# Patient Record
Sex: Male | Born: 1990
Health system: Southern US, Community
[De-identification: ages and names within clinical notes are randomized; demographics above are authoritative.]

## PROBLEM LIST (undated history)

## (undated) DIAGNOSIS — Z8679 Personal history of other diseases of the circulatory system: Secondary | ICD-10-CM

## (undated) DIAGNOSIS — R42 Dizziness and giddiness: Secondary | ICD-10-CM

## (undated) DIAGNOSIS — E66813 Obesity, class 3: Secondary | ICD-10-CM

## (undated) DIAGNOSIS — F418 Other specified anxiety disorders: Secondary | ICD-10-CM

## (undated) HISTORY — DX: Personal history of other diseases of the circulatory system: Z86.79

## (undated) HISTORY — DX: Obesity, class 3: E66.813

## (undated) HISTORY — DX: Dizziness and giddiness: R42

## (undated) HISTORY — PX: ABLATION: SHX5711

## (undated) HISTORY — DX: Other specified anxiety disorders: F41.8

## (undated) HISTORY — DX: Morbid (severe) obesity due to excess calories: E66.01

---
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
## 2015-07-16 DIAGNOSIS — R0789 Other chest pain: Secondary | ICD-10-CM

## 2015-07-16 HISTORY — DX: Other chest pain: R07.89

## 2016-06-20 DIAGNOSIS — H66001 Acute suppurative otitis media without spontaneous rupture of ear drum, right ear: Secondary | ICD-10-CM | POA: Diagnosis not present

## 2016-06-20 DIAGNOSIS — J0100 Acute maxillary sinusitis, unspecified: Secondary | ICD-10-CM | POA: Diagnosis not present

## 2016-06-20 DIAGNOSIS — J45909 Unspecified asthma, uncomplicated: Secondary | ICD-10-CM | POA: Diagnosis not present

## 2016-08-17 DIAGNOSIS — J302 Other seasonal allergic rhinitis: Secondary | ICD-10-CM | POA: Diagnosis not present

## 2016-09-14 DIAGNOSIS — H81399 Other peripheral vertigo, unspecified ear: Secondary | ICD-10-CM | POA: Diagnosis not present

## 2016-09-14 DIAGNOSIS — M542 Cervicalgia: Secondary | ICD-10-CM | POA: Diagnosis not present

## 2016-11-01 DIAGNOSIS — R1012 Left upper quadrant pain: Secondary | ICD-10-CM | POA: Diagnosis not present

## 2016-11-01 DIAGNOSIS — R42 Dizziness and giddiness: Secondary | ICD-10-CM | POA: Diagnosis not present

## 2016-11-02 DIAGNOSIS — R0789 Other chest pain: Secondary | ICD-10-CM | POA: Diagnosis not present

## 2016-11-02 DIAGNOSIS — R5381 Other malaise: Secondary | ICD-10-CM | POA: Diagnosis not present

## 2016-11-02 DIAGNOSIS — R5383 Other fatigue: Secondary | ICD-10-CM | POA: Diagnosis not present

## 2016-11-02 DIAGNOSIS — R55 Syncope and collapse: Secondary | ICD-10-CM | POA: Diagnosis not present

## 2016-11-02 DIAGNOSIS — R0602 Shortness of breath: Secondary | ICD-10-CM | POA: Diagnosis not present

## 2016-11-05 DIAGNOSIS — R42 Dizziness and giddiness: Secondary | ICD-10-CM | POA: Diagnosis not present

## 2016-11-26 DIAGNOSIS — J060 Acute laryngopharyngitis: Secondary | ICD-10-CM | POA: Diagnosis not present

## 2016-12-04 DIAGNOSIS — R55 Syncope and collapse: Secondary | ICD-10-CM | POA: Diagnosis not present

## 2016-12-13 DIAGNOSIS — R55 Syncope and collapse: Secondary | ICD-10-CM | POA: Diagnosis not present

## 2016-12-28 DIAGNOSIS — R55 Syncope and collapse: Secondary | ICD-10-CM | POA: Diagnosis not present

## 2017-01-17 DIAGNOSIS — R55 Syncope and collapse: Secondary | ICD-10-CM

## 2017-01-17 HISTORY — DX: Syncope and collapse: R55

## 2017-02-02 DIAGNOSIS — M549 Dorsalgia, unspecified: Secondary | ICD-10-CM | POA: Diagnosis not present

## 2017-02-02 DIAGNOSIS — S39012A Strain of muscle, fascia and tendon of lower back, initial encounter: Secondary | ICD-10-CM | POA: Diagnosis not present

## 2017-02-02 DIAGNOSIS — M545 Low back pain: Secondary | ICD-10-CM | POA: Diagnosis not present

## 2017-02-17 DIAGNOSIS — M545 Low back pain: Secondary | ICD-10-CM | POA: Diagnosis not present

## 2017-02-23 DIAGNOSIS — J060 Acute laryngopharyngitis: Secondary | ICD-10-CM | POA: Diagnosis not present

## 2017-05-16 DIAGNOSIS — J060 Acute laryngopharyngitis: Secondary | ICD-10-CM | POA: Diagnosis not present

## 2017-05-26 DIAGNOSIS — J060 Acute laryngopharyngitis: Secondary | ICD-10-CM | POA: Diagnosis not present

## 2017-07-26 DIAGNOSIS — J060 Acute laryngopharyngitis: Secondary | ICD-10-CM | POA: Diagnosis not present

## 2017-07-26 DIAGNOSIS — R112 Nausea with vomiting, unspecified: Secondary | ICD-10-CM | POA: Diagnosis not present

## 2017-08-08 DIAGNOSIS — J060 Acute laryngopharyngitis: Secondary | ICD-10-CM | POA: Diagnosis not present

## 2017-09-07 DIAGNOSIS — J324 Chronic pansinusitis: Secondary | ICD-10-CM | POA: Diagnosis not present

## 2017-10-18 DIAGNOSIS — R109 Unspecified abdominal pain: Secondary | ICD-10-CM | POA: Diagnosis not present

## 2017-10-18 DIAGNOSIS — Z792 Long term (current) use of antibiotics: Secondary | ICD-10-CM | POA: Diagnosis not present

## 2017-10-18 DIAGNOSIS — Z79891 Long term (current) use of opiate analgesic: Secondary | ICD-10-CM | POA: Diagnosis not present

## 2017-12-10 DIAGNOSIS — I252 Old myocardial infarction: Secondary | ICD-10-CM | POA: Diagnosis not present

## 2017-12-10 DIAGNOSIS — R0789 Other chest pain: Secondary | ICD-10-CM | POA: Diagnosis not present

## 2017-12-10 DIAGNOSIS — R079 Chest pain, unspecified: Secondary | ICD-10-CM | POA: Diagnosis not present

## 2017-12-11 DIAGNOSIS — R079 Chest pain, unspecified: Secondary | ICD-10-CM | POA: Diagnosis not present

## 2017-12-11 DIAGNOSIS — R0602 Shortness of breath: Secondary | ICD-10-CM

## 2017-12-22 DIAGNOSIS — I456 Pre-excitation syndrome: Secondary | ICD-10-CM

## 2017-12-22 DIAGNOSIS — R0789 Other chest pain: Secondary | ICD-10-CM | POA: Diagnosis not present

## 2017-12-22 HISTORY — DX: Pre-excitation syndrome: I45.6

## 2017-12-29 DIAGNOSIS — R0789 Other chest pain: Secondary | ICD-10-CM | POA: Diagnosis not present

## 2017-12-29 DIAGNOSIS — I456 Pre-excitation syndrome: Secondary | ICD-10-CM | POA: Diagnosis not present

## 2018-01-19 DIAGNOSIS — R000 Tachycardia, unspecified: Secondary | ICD-10-CM | POA: Diagnosis not present

## 2018-02-02 DIAGNOSIS — R0789 Other chest pain: Secondary | ICD-10-CM | POA: Diagnosis not present

## 2018-06-10 DIAGNOSIS — J209 Acute bronchitis, unspecified: Secondary | ICD-10-CM | POA: Diagnosis not present

## 2018-07-03 DIAGNOSIS — R1032 Left lower quadrant pain: Secondary | ICD-10-CM | POA: Diagnosis not present

## 2018-07-03 DIAGNOSIS — R300 Dysuria: Secondary | ICD-10-CM | POA: Diagnosis not present

## 2018-07-03 DIAGNOSIS — R109 Unspecified abdominal pain: Secondary | ICD-10-CM | POA: Diagnosis not present

## 2020-02-19 DIAGNOSIS — R55 Syncope and collapse: Secondary | ICD-10-CM | POA: Insufficient documentation

## 2020-02-19 DIAGNOSIS — Z6841 Body Mass Index (BMI) 40.0 and over, adult: Secondary | ICD-10-CM | POA: Insufficient documentation

## 2020-02-19 DIAGNOSIS — E66813 Obesity, class 3: Secondary | ICD-10-CM | POA: Insufficient documentation

## 2020-02-19 DIAGNOSIS — Z8679 Personal history of other diseases of the circulatory system: Secondary | ICD-10-CM | POA: Insufficient documentation

## 2020-02-19 DIAGNOSIS — R42 Dizziness and giddiness: Secondary | ICD-10-CM | POA: Insufficient documentation

## 2020-02-19 DIAGNOSIS — F418 Other specified anxiety disorders: Secondary | ICD-10-CM | POA: Insufficient documentation

## 2020-02-21 ENCOUNTER — Ambulatory Visit: Payer: Self-pay | Admitting: Cardiology

## 2020-03-06 ENCOUNTER — Ambulatory Visit: Payer: Self-pay | Admitting: Cardiology

## 2020-03-10 ENCOUNTER — Encounter: Payer: Self-pay | Admitting: *Deleted

## 2020-03-12 ENCOUNTER — Ambulatory Visit: Payer: Self-pay | Admitting: Cardiology

## 2021-01-29 ENCOUNTER — Emergency Department (HOSPITAL_COMMUNITY): Payer: Managed Care, Other (non HMO)

## 2021-01-29 ENCOUNTER — Emergency Department (HOSPITAL_COMMUNITY)
Admission: EM | Admit: 2021-01-29 | Discharge: 2021-01-29 | Disposition: A | Discharge status: Home / Self Care | Payer: Managed Care, Other (non HMO) | Attending: Emergency Medicine | Admitting: Emergency Medicine

## 2021-01-29 ENCOUNTER — Other Ambulatory Visit: Payer: Self-pay

## 2021-01-29 DIAGNOSIS — H538 Other visual disturbances: Secondary | ICD-10-CM | POA: Insufficient documentation

## 2021-01-29 DIAGNOSIS — R531 Weakness: Secondary | ICD-10-CM | POA: Insufficient documentation

## 2021-01-29 DIAGNOSIS — F0781 Postconcussional syndrome: Secondary | ICD-10-CM

## 2021-01-29 DIAGNOSIS — R519 Headache, unspecified: Secondary | ICD-10-CM | POA: Diagnosis not present

## 2021-01-29 DIAGNOSIS — M542 Cervicalgia: Secondary | ICD-10-CM | POA: Diagnosis not present

## 2021-01-29 DIAGNOSIS — R202 Paresthesia of skin: Secondary | ICD-10-CM

## 2021-01-29 LAB — CBC WITH DIFFERENTIAL/PLATELET
Abs Immature Granulocytes: 0.02 10*3/uL (ref 0.00–0.07)
Basophils Absolute: 0.1 10*3/uL (ref 0.0–0.1)
Basophils Relative: 1 %
Eosinophils Absolute: 0.3 10*3/uL (ref 0.0–0.5)
Eosinophils Relative: 4 %
HCT: 44.8 % (ref 39.0–52.0)
Hemoglobin: 14.9 g/dL (ref 13.0–17.0)
Immature Granulocytes: 0 %
Lymphocytes Relative: 33 %
Lymphs Abs: 2.4 10*3/uL (ref 0.7–4.0)
MCH: 28.2 pg (ref 26.0–34.0)
MCHC: 33.3 g/dL (ref 30.0–36.0)
MCV: 84.8 fL (ref 80.0–100.0)
Monocytes Absolute: 0.6 10*3/uL (ref 0.1–1.0)
Monocytes Relative: 8 %
Neutro Abs: 4 10*3/uL (ref 1.7–7.7)
Neutrophils Relative %: 54 %
Platelets: 306 10*3/uL (ref 150–400)
RBC: 5.28 MIL/uL (ref 4.22–5.81)
RDW: 12.4 % (ref 11.5–15.5)
WBC: 7.3 10*3/uL (ref 4.0–10.5)
nRBC: 0 % (ref 0.0–0.2)

## 2021-01-29 LAB — BASIC METABOLIC PANEL
Anion gap: 5 (ref 5–15)
BUN: 10 mg/dL (ref 6–20)
CO2: 30 mmol/L (ref 22–32)
Calcium: 9.1 mg/dL (ref 8.9–10.3)
Chloride: 102 mmol/L (ref 98–111)
Creatinine, Ser: 0.82 mg/dL (ref 0.61–1.24)
GFR, Estimated: >60 mL/min (ref >60)
Glucose, Bld: 94 mg/dL (ref 70–99)
Potassium: 4 mmol/L (ref 3.5–5.1)
Sodium: 137 mmol/L (ref 135–145)

## 2021-01-29 MED ORDER — PROCHLORPERAZINE MALEATE 5 MG PO TABS: 5.0000 mg | ORAL_TABLET | Freq: Four times a day (QID) | ORAL | 0 refills | Status: DC | PRN

## 2021-01-29 MED ORDER — IOHEXOL 350 MG/ML SOLN
65.0000 mL | Freq: Once | INTRAVENOUS | Status: AC | PRN
  Administered 2021-01-29: 65 mL via INTRAVENOUS

## 2021-01-29 MED ORDER — SODIUM CHLORIDE 0.9 % IV BOLUS
1000.0000 mL | Freq: Once | INTRAVENOUS | Status: AC
  Administered 2021-01-29: 1000 mL via INTRAVENOUS

## 2021-01-29 MED ORDER — PROCHLORPERAZINE EDISYLATE 10 MG/2ML IJ SOLN
10.0000 mg | Freq: Once | INTRAMUSCULAR | Status: AC
  Administered 2021-01-29: 10 mg via INTRAVENOUS
  Filled 2021-01-29: qty 2

## 2021-01-29 MED ORDER — DIPHENHYDRAMINE HCL 50 MG/ML IJ SOLN
25.0000 mg | Freq: Once | INTRAMUSCULAR | Status: AC
  Administered 2021-01-29: 25 mg via INTRAVENOUS
  Filled 2021-01-29: qty 1

## 2021-01-29 NOTE — ED Provider Notes (Signed)
[facility] Provider Note   CSN: [id number] Arrival date & time: 01/29/21  1319     History Chief Complaint  Patient presents with   Head Injury    Mike BalsamJason Nelson is a 30 y.o. male.   Head Injury Associated symptoms: headache and numbness   Associated symptoms: no nausea, no neck pain and no vomiting      Patient has a history of three prior concussions is coming in with a headache, blurry vision, and right sided weakness that is waxing and waning after another concussion on Monday. He said that during his Monday concussion he did not lose consciousness but felt dizzy and got checked out at urgent care where he was diagnosed with a concussion. However he has fallen about three times in the last several days because he suddenly started experiencing arm and leg weakness on his right side. He said that the weakness comes and goes and lasts for about 10-15 minutes and is associated with the blurry vision and the headaches. Either a headache or the blurry vision will precede the weakness. When he gets the weakness he has fallen if he is not holding on to something because he is unable to feel his limb. He says that he is able to still move his arm and leg but they feel strange and weak. He denies any tingling with this. The weakness does not involve his face, he denies any facial asymmetry, and does not have any problems with speech. He also denies any nausea or vomiting. These symptoms he has been having since his concussion are similar to his prior concussions except that the weakness is new. Denies any neck pain. The dizziness he describes as the room is spinning, it is not made any worse by changes in head or neck position. The blurry vision occurs in both eyes in all fields of vision. He endorses a history of migraine headaches with aura but that the vision changes he gets with these are different that his current presentation. He does not take any  medication for these headaches frequently, but has tried excedrin in the past. He says that these symptoms are all waxing and waning and has not identified any triggers that cause them or anything that makes them better.  Past Medical History:  Diagnosis Date   Anxiety with depression    Atypical chest pain 07/16/2015   Class 3 severe obesity due to excess calories with body mass index (BMI) of 40.0 to 44.9 in adult [facility](HCC)    History of Wolff-Parkinson-White (WPW) syndrome    Postural dizziness with presyncope    Vasovagal syncope 01/17/2017   WPW (Wolff-Parkinson-White syndrome) 12/22/2017    Patient Active Problem List   Diagnosis Date Noted   Anxiety with depression    Class 3 severe obesity due to excess calories with body mass index (BMI) of 40.0 to 44.9 in adult [facility](HCC)    History of Wolff-Parkinson-White (WPW) syndrome    Postural dizziness with presyncope    WPW (Wolff-Parkinson-White syndrome) 12/22/2017   Vasovagal syncope 01/17/2017   Atypical chest pain 07/16/2015       Family History  Problem Relation Age of Onset   Diabetes Mother    Hypertension Father     Social History   Tobacco Use   Smoking status: Never   Smokeless tobacco: Never    Home Medications Prior to Admission medications   Medication Sig Start Date End Date Taking? Authorizing Provider  Ascorbic Acid (VITAMIN C)  1000 MG tablet Take 1,000 mg by mouth daily.   Yes [provider]  fexofenadine (ALLEGRA) 60 MG tablet Take 60 mg by mouth daily.   Yes [provider]  vitamin B-12 (CYANOCOBALAMIN) 1000 MCG tablet Take 1,000 mcg by mouth daily.   Yes [provider]  Cholecalciferol (VITAMIN D) 50 MCG (2000 UT) CAPS Take 50 mcg by mouth daily. Patient not taking: Reported on 01/29/2021    [provider]    Allergies    Mushroom extract complex and Pineapple  Review of Systems   Review of Systems  Eyes:  Positive for visual disturbance.  Respiratory:  Negative  for shortness of breath.   Cardiovascular:  Negative for chest pain and palpitations.  Gastrointestinal:  Negative for nausea and vomiting.  Musculoskeletal:  Negative for neck pain and neck stiffness.  Neurological:  Positive for dizziness, numbness and headaches. Negative for syncope, facial asymmetry and speech difficulty.  Psychiatric/Behavioral:  Positive for confusion and decreased concentration.    Physical Exam Updated Vital Signs BP (!) 126/93   Pulse (!) 110   Resp 17   Ht 5\' 11"  (1.803 m)   Wt 130.2 kg   SpO2 99%   BMI 40.03 kg/m   Physical Exam Constitutional:      Appearance: He is obese.  HENT:     Head: Normocephalic.  Eyes:     General: No visual field deficit.    Extraocular Movements: Extraocular movements intact.     Conjunctiva/sclera: Conjunctivae normal.     Pupils: Pupils are equal, round, and reactive to light.  Cardiovascular:     Rate and Rhythm: Normal rate and regular rhythm.  Pulmonary:     Effort: Pulmonary effort is normal.     Breath sounds: Normal breath sounds.  Abdominal:     Palpations: Abdomen is soft.  Musculoskeletal:     Cervical back: Normal range of motion. Tenderness present.  Neurological:     General: No focal deficit present.     Mental Status: He is alert and oriented to person, place, and time.     Cranial Nerves: No cranial nerve deficit or facial asymmetry.     Sensory: No sensory deficit.     Motor: Motor function is intact. No weakness.     Coordination: Coordination is intact.    ED Results / Procedures / Treatments   Labs (all labs ordered are listed, but only abnormal results are displayed) Labs Reviewed  BASIC METABOLIC PANEL  CBC WITH DIFFERENTIAL/PLATELET    EKG EKG Interpretation  Date/Time:  Thursday January 29 2021 13:34:02 EDT Ventricular Rate:  88 PR Interval:  150 QRS Duration: 80 QT Interval:  340 QTC Calculation: 411 R Axis:   48 Text Interpretation: Normal sinus rhythm Normal ECG No prior  ECG for comparison. No STEMI Confirmed by [id number] (Theda Belfast) on 01/29/2021 3:37:18 PM  Radiology CT Angio Head W or Wo Contrast  Result Date: 01/29/2021 CLINICAL DATA:  Neuro deficit, acute, stroke suspected; Waxing and waning right arm and right leg numbness and weakness, vision changes, and dizziness after head trauma. EXAM: CT ANGIOGRAPHY HEAD AND NECK TECHNIQUE: Multidetector CT imaging of the head and neck was performed using the standard protocol during bolus administration of intravenous contrast. Multiplanar CT image reconstructions and MIPs were obtained to evaluate the vascular anatomy. Carotid stenosis measurements (when applicable) are obtained utilizing NASCET criteria, using the distal internal carotid diameter as the denominator. CONTRAST:  32mL OMNIPAQUE IOHEXOL 350 MG/ML SOLN COMPARISON:  Brain MRI performed earlier today 01/29/2021. FINDINGS: CTA NECK FINDINGS Aortic arch: Standard aortic branching. The visualized aortic arch is normal in caliber. No hemodynamically significant innominate or proximal subclavian artery stenosis. Right carotid system: CCA and ICA patent within the neck without stenosis or significant atherosclerotic disease. Partially retropharyngeal course of the ICA. Left carotid system: CCA and ICA patent within the neck without stenosis or significant atherosclerotic disease. Partially retropharyngeal course of the ICA. Vertebral arteries: Vertebral arteries codominant and patent within the neck without stenosis. Skeleton: Reversal of the expected cervical lordosis. No acute bony abnormality or aggressive osseous lesion. 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 Other neck: No neck mass. Thyroid unremarkable. Mildly enlarged right level 2 lymph node measuring 12 mm in short axis. Elsewhere within the neck, lymph nodes are somewhat prominent, but measure subcentimeter in short axis. Upper  chest: No consolidation within the imaged lung apices. Review of the MIP images confirms the above findings CTA HEAD FINDINGS Anterior circulation: The intracranial internal carotid arteries are patent. The M1 middle cerebral arteries are patent. No M2 proximal branch occlusion or high-grade proximal stenosis is identified. The anterior cerebral arteries are patent. No intracranial aneurysm is identified. Posterior circulation: The intracranial vertebral arteries are patent. The basilar artery is patent. The posterior cerebral arteries are patent. Posterior communicating arteries are hypoplastic or absent bilaterally. Venous sinuses: Within the limitations of contrast timing, no convincing thrombus. Anatomic variants: As described Review of the MIP images confirms the above findings IMPRESSION: CTA neck: 1. The common carotid, internal carotid and vertebral arteries are patent within the neck without stenosis, significant atherosclerotic disease or evidence of dissection. 2. Mildly enlarged right level 2 lymph node measuring 13 mm in short axis. Lymph nodes elsewhere within the neck are somewhat prominent, but measure subcentimeter in short axis. Clinical correlation and clinical follow-up recommended (with imaging follow-up as clinically warranted). 3. 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 CTA head: Unremarkable exam. No intracranial large vessel occlusion or proximal high-grade arterial stenosis. Electronically Signed   By: Jackey Loge D.O.   On: 01/29/2021 20:17   CT HEAD WO CONTRAST ( )  Result Date: 01/29/2021 CLINICAL DATA:  Head trauma EXAM: CT HEAD WITHOUT CONTRAST TECHNIQUE: Contiguous axial images were obtained from the base of the skull through the vertex without intravenous contrast. COMPARISON:  10/20/2020 FINDINGS: Brain: No evidence of acute infarction, hemorrhage, hydrocephalus, extra-axial collection or  mass lesion/mass effect. Vascular: No hyperdense vessel or unexpected calcification. Skull: Normal. Negative for fracture or focal lesion. Sinuses/Orbits: No acute finding. Other: The mastoid air cells are clear. IMPRESSION: No acute intracranial process. Electronically Signed   By: Wiliam Ke M.D.   On: 01/29/2021 14:57   CT Angio Neck W and/or Wo Contrast  Result Date: 01/29/2021 CLINICAL DATA:  Neuro deficit, acute, stroke suspected; Waxing and waning right arm and right leg numbness and weakness, vision changes, and dizziness after head trauma. EXAM: CT ANGIOGRAPHY HEAD AND NECK TECHNIQUE: Multidetector CT imaging of the head and neck was performed using the standard protocol during bolus administration of intravenous contrast. Multiplanar CT image reconstructions and MIPs were obtained to evaluate the vascular anatomy. Carotid stenosis measurements (when applicable) are obtained utilizing NASCET criteria, using the distal internal carotid diameter as the denominator. CONTRAST:  65mL OMNIPAQUE IOHEXOL 350 MG/ML SOLN COMPARISON:  Brain MRI performed earlier today 01/29/2021. FINDINGS: CTA NECK  FINDINGS Aortic arch: Standard aortic branching. The visualized aortic arch is normal in caliber. No hemodynamically significant innominate or proximal subclavian artery stenosis. Right carotid system: CCA and ICA patent within the neck without stenosis or significant atherosclerotic disease. Partially retropharyngeal course of the ICA. Left carotid system: CCA and ICA patent within the neck without stenosis or significant atherosclerotic disease. Partially retropharyngeal course of the ICA. Vertebral arteries: Vertebral arteries codominant and patent within the neck without stenosis. Skeleton: Reversal of the expected cervical lordosis. No acute bony abnormality or aggressive osseous lesion. 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  Other neck: No neck mass. Thyroid unremarkable. Mildly enlarged right level 2 lymph node measuring 12 mm in short axis. Elsewhere within the neck, lymph nodes are somewhat prominent, but measure subcentimeter in short axis. Upper chest: No consolidation within the imaged lung apices. Review of the MIP images confirms the above findings CTA HEAD FINDINGS Anterior circulation: The intracranial internal carotid arteries are patent. The M1 middle cerebral arteries are patent. No M2 proximal branch occlusion or high-grade proximal stenosis is identified. The anterior cerebral arteries are patent. No intracranial aneurysm is identified. Posterior circulation: The intracranial vertebral arteries are patent. The basilar artery is patent. The posterior cerebral arteries are patent. Posterior communicating arteries are hypoplastic or absent bilaterally. Venous sinuses: Within the limitations of contrast timing, no convincing thrombus. Anatomic variants: As described Review of the MIP images confirms the above findings IMPRESSION: CTA neck: 1. The common carotid, internal carotid and vertebral arteries are patent within the neck without stenosis, significant atherosclerotic disease or evidence of dissection. 2. Mildly enlarged right level 2 lymph node measuring 13 mm in short axis. Lymph nodes elsewhere within the neck are somewhat prominent, but measure subcentimeter in short axis. Clinical correlation and clinical follow-up recommended (with imaging follow-up as clinically warranted). 3. 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 CTA head: Unremarkable exam. No intracranial large vessel occlusion or proximal high-grade arterial stenosis. Electronically Signed   By: Jackey Loge D.O.   On: 01/29/2021 20:17   MR BRAIN WO CONTRAST  Result Date: 01/29/2021 CLINICAL DATA:  Neuro deficit, acute, stroke suspected; Waxing and waning right arm and  right leg numbness and weakness, vision changes, and dizziness after head trauma. EXAM: MRI HEAD WITHOUT CONTRAST TECHNIQUE: Multiplanar, multiecho pulse sequences of the brain and surrounding structures were obtained without intravenous contrast. COMPARISON:  Prior head CT examinations 01/29/2021 and earlier. FINDINGS: Brain: Cerebral volume is normal. No cortical encephalomalacia is identified. No significant cerebral white matter disease. The cerebellar tonsils are somewhat low-lying, extending 2-3 mm below the level of the foramen magnum (but within normal limits). There is no acute infarct. No evidence of an intracranial mass. No chronic intracranial blood products. No extra-axial fluid collection. No midline shift. Vascular: Maintained flow voids within the proximal large arterial vessels. Skull and upper cervical spine: No focal marrow lesion. Sinuses/Orbits: Visualized orbits show no acute finding. 18 mm left maxillary sinus mucous retention cyst. Other: Right parietal scalp edema. IMPRESSION: Unremarkable non-contrast MRI appearance of the brain. No evidence of acute intracranial abnormality. 18 mm left maxillary sinus mucous retention cyst. Right parietal scalp edema. Electronically Signed   By: Jackey Loge D.O.   On: 01/29/2021 17:45    Procedures Procedures   Medications Ordered in ED Medications  sodium chloride 0.9 % bolus 1,000 mL (1,000 mLs Intravenous  New Bag/Given 01/29/21 1629)  prochlorperazine (COMPAZINE) injection 10 mg (10 mg Intravenous Given 01/29/21 1630)  diphenhydrAMINE (BENADRYL) injection 25 mg (25 mg Intravenous Given 01/29/21 1630)  iohexol (OMNIPAQUE) 350 MG/ML injection 65 mL (65 mLs Intravenous Contrast Given 01/29/21 1934)    ED Course  I have reviewed the triage vital signs and the nursing notes.  Pertinent labs & imaging results that were available during my care of the patient were reviewed by me and considered in my medical decision making (see chart for  details).  Clinical Course as of 01/29/21 2034  Thu Jan 29, 2021  1439 Patient not in room for evaluation [BM]  1458 Patient not in room for evaluation. [BM]    Clinical Course User Index [BM] Elizabeth Palau   MDM Rules/Calculators/A&P                           Patient has normal neuro exam with no appreciable deficits, says that symptoms continue to wax and wane since presentation. Endorses similar symptoms with prior concussions and migraines. CT of head did not show acute bleed. MRI brain did not show infarct. CTA also unremarkable for vessel disease, one mildly enlarged lymph node, recommend follow up with PCP. Headache improved with headache cocktail. Suspect presentation is related to patients history of recurrent head injury and that this related to his concussion and stable for discharge.  Final Clinical Impression(s) / ED Diagnoses Final diagnoses:  None    Rx / DC Orders ED Discharge Orders     None        Ilene Qua, MD 01/29/21 2035    Tegeler, Canary Brim, MD 01/30/21 [phone]

## 2021-01-29 NOTE — ED Provider Notes (Signed)
Emergency Medicine Provider Triage Evaluation Note  Mike Nelson , a 30 y.o. male  was evaluated in triage.  Pt complains of head injury dizziness, right-sided weakness.  Reports that on Monday he had a mechanical fall which led him to hit his head on a shelving unit.  No loss of consciousness associated with his head injury.  Patient endorsed dizziness intermittently after this incident.  Patient states that he was diagnosed with a concussion.  Over the following days patient has had intermittent dizziness, blurry vision, and right-sided weakness.  Patient states that right-sided weakness has caused him to fall multiple times at home.  Last episode of right-sided weakness occurred yesterday.  Patient endorses headache at present.  At present patient denies any numbness, weakness, facial asymmetry, aphasia, dysphagia.  Patient denies any neck pain, back pain, saddle anesthesia, bowel or bladder dysfunction.  Patient is not on any blood thinners.  Review of Systems  Positive: Blurry vision, dizziness, numbness, weakness, headache Negative: neck pain, back pain, saddle anesthesia, bowel or bladder dysfunction  Physical Exam  Ht 5\' 11"  (1.803 m)   Wt 130.2 kg   BMI 40.03 kg/m  Gen:   Awake, no distress   Resp:  Normal effort  MSK:   Moves extremities without difficulty.  No midline tenderness or deformity to cervical, thoracic, or lumbar spine. Other:  CN II through XII intact.  Pronator drift negative.  +5 bilateral upper and lower extremities.  Sensation to light touch intact to bilateral upper and lower extremities.  Head is atraumatic.  Neck supple.  Medical Decision Making  Medically screening exam initiated at 1:35 PM.  Appropriate orders placed.  Mike Nelson was informed that the remainder of the evaluation will be completed by another provider, this initial triage assessment does not replace that evaluation, and the importance of remaining in the ED until their evaluation is  complete.  The patient appears stable so that the remainder of the work up may be completed by another provider.      Gypsy Balsam, PA-C 01/29/21 1338    01/31/21, MD 01/31/21 [phone]

## 2021-01-29 NOTE — Discharge Instructions (Addendum)
You were seen for blurry vision and weakness today at [facility]. We did some imaging of your head and neck and were able to determine that you were not having a stroke, a brain bleed, or damage to some of the vessels in your neck. You were also given some medication to help with your headaches which you can continue to take as needed. We did see one slightly enlarged lymph node in your neck which could be related to a viral illness. We recommend that you follow up with your primary care doctor who can also set you up with a neurology appointment if desired.   If your symptoms worsen and you have trouble walking, new slurred speech, or worsening confusion we recommend that you come back to the emergency room to get checked out.

## 2021-01-29 NOTE — ED Notes (Signed)
Pt discharged and ambulated out of the ED without difficulty. 

## 2021-01-29 NOTE — ED Notes (Signed)
Provider at bedside

## 2021-01-29 NOTE — ED Notes (Signed)
Patient returned from MRI.

## 2021-01-29 NOTE — ED Triage Notes (Signed)
Pt dx with at [facility] with concussion from fall on Monday. Since then has had multiple episodes of dizziness and weakness in R side of body, causing him to fall. Last fall yesterday.

## 2021-03-18 ENCOUNTER — Encounter: Payer: Self-pay | Admitting: Neurology

## 2021-03-18 ENCOUNTER — Ambulatory Visit: Payer: Managed Care, Other (non HMO) | Admitting: Neurology

## 2021-03-18 VITALS — BP 123/79 | HR 75 | Ht 71.0 in | Wt 290.0 lb

## 2021-03-18 DIAGNOSIS — F0781 Postconcussional syndrome: Secondary | ICD-10-CM | POA: Diagnosis not present

## 2021-03-18 DIAGNOSIS — R42 Dizziness and giddiness: Secondary | ICD-10-CM | POA: Diagnosis not present

## 2021-03-18 MED ORDER — AMITRIPTYLINE HCL 25 MG PO TABS: 25.0000 mg | ORAL_TABLET | Freq: Every day | ORAL | 0 refills | Status: DC

## 2021-03-18 NOTE — Patient Instructions (Signed)
Start Amitriptyline 1/2 tablet for the first week then increase to full tablet nightly  Return as needed

## 2021-03-18 NOTE — Progress Notes (Signed)
[facility]  PATIENT: Mike Nelson DOB: April 20, 1991  REFERRING CLINICIAN: Maurie Boettcher T, F* HISTORY FROM: Patient  REASON FOR VISIT: Headaches, Numbness/tingling, dizziness   HISTORICAL  CHIEF COMPLAINT:  Chief Complaint  Patient presents with   New Patient (Initial Visit)    Rm 12, with mother, pt c/o left side numbness and tingling     HISTORY OF PRESENT ILLNESS:  This is a 30 year old male with past medical history of obesity who is presenting after concussion with multiple complaints including headache, left-sided weakness and numbness, and multiple falls.  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 Patient said that he did not pass out after the event but 1 hour later felt dizzy therefore went to the urgent care and was diagnosed with concussion.  Then a couple days after his diagnosis of concussion he started to fall.  He describes diffuse weakness and body goes limp prior to the fall. He presented to the ED  for recurrent fall and left-sided weakness.  He had a full work-up including CT, CTA and MRI brain which were all negative for any acute stroke, no vertebral dissection and no other abnormal finding. He also reported numbness starting from the left collarbone all the way down to left foot numbness, intermittent last between 30 minutes to 1 hour, nothing makes it better or worse and when he has the numbness there is associated weakness.  He also report what he describes as "stroke-like symptoms".  Patient said that he would get extremely cold, lose the grip of his hand, he will be laying in his bed and starts shaking uncontrollably. This can last up to 45 minutes and during this episode he is perfectly aware and even can have a full conversation but is unable to stop the shaking. Additional complaint brought up today are his headaches.  He said that he has a history of  headaches/migraine but since the concussion the headaches are worse He stated his sleep is difficult sometimes he cannot sleep the whole night he is currently under a lot of stress bcause his job is going to close soon, and He has a baby on the way in 1 month.  Currently he is looking for another job but stated that his stress level is very high.   OTHER MEDICAL CONDITIONS: None reported    REVIEW OF SYSTEMS: Full 14 system review of systems performed and negative with exception of: as noted in the HPI  ALLERGIES: Allergies  Allergen Reactions   Mushroom Extract Complex Swelling   Pineapple Hives    HOME MEDICATIONS: Outpatient Medications Prior to Visit  Medication Sig Dispense Refill   Ascorbic Acid (VITAMIN C) 1000 MG tablet Take 1,000 mg by mouth daily.     Cholecalciferol (VITAMIN D) 50 MCG (2000 UT) CAPS Take 50 mcg by mouth daily.     fexofenadine (ALLEGRA) 60 MG tablet Take 60 mg by mouth daily.     prochlorperazine (COMPAZINE) 5 MG tablet Take 1 tablet (5 mg total) by mouth every 6 (six) hours as needed for nausea or vomiting. 20 tablet 0   vitamin B-12 (CYANOCOBALAMIN) 1000 MCG tablet Take 1,000 mcg by mouth daily.     No facility-administered medications prior to visit.    PAST MEDICAL HISTORY: Past Medical History:  Diagnosis Date   Anxiety with depression    Atypical chest pain 07/16/2015   Class 3 severe obesity due to  excess calories with body mass index (BMI) of 40.0 to 44.9 in adult [facility])    History of Wolff-Parkinson-White (WPW) syndrome    Postural dizziness with presyncope    Vasovagal syncope 01/17/2017   WPW (Wolff-Parkinson-White syndrome) 12/22/2017    PAST SURGICAL HISTORY: Past Surgical History:  Procedure Laterality Date   ABLATION     cardiac    FAMILY HISTORY: Family History  Problem Relation Age of Onset   Diabetes Mother    Hypertension Father     SOCIAL HISTORY: Social History   Socioeconomic History   Marital status: Married     Spouse name: Not on file   Number of children: Not on file   Years of education: Not on file   Highest education level: Not on file  Occupational History   Not on file  Tobacco Use   Smoking status: Never   Smokeless tobacco: Never  Substance and Sexual Activity   Alcohol use: Not on file   Drug use: Not on file   Sexual activity: Not on file  Other Topics Concern   Not on file  Social History Narrative   Not on file   Social Determinants of Health   Financial Resource Strain: Not on file  Food Insecurity: Not on file  Transportation Needs: Not on file  Physical Activity: Not on file  Stress: Not on file  Social Connections: Not on file  Intimate Partner Violence: Not on file    PHYSICAL EXAM  GENERAL EXAM/CONSTITUTIONAL: Vitals:  Vitals:   03/18/21 0816  BP: 123/79  Pulse: 75  Weight: 290 lb (131.5 kg)  Height: 5\' 11"  (1.803 m)   Body mass index is 40.45 kg/m. Wt Readings from Last 3 Encounters:  03/18/21 290 lb (131.5 kg)  01/29/21 287 lb (130.2 kg)   Patient is in no distress; well developed, nourished and groomed; neck is supple  CARDIOVASCULAR: Examination of carotid arteries is normal; no carotid bruits Regular rate and rhythm, no murmurs Examination of peripheral vascular system by observation and palpation is normal  EYES: Pupils round and reactive to light, Visual fields full to confrontation, Extraocular movements intacts,   MUSCULOSKELETAL: Gait, strength, tone, movements noted in Neurologic exam below  NEUROLOGIC: MENTAL STATUS:  No flowsheet data found. awake, alert, oriented to person, place and time recent and remote memory intact normal attention and concentration language fluent, comprehension intact, naming intact fund of knowledge appropriate  CRANIAL NERVE:  2nd, 3rd, 4th, 6th - pupils equal and reactive to light, visual fields full to confrontation, extraocular muscles intact, no nystagmus 5th - facial sensation symmetric 7th  - facial strength symmetric 8th - hearing intact 9th - palate elevates symmetrically, uvula midline 11th - shoulder shrug symmetric 12th - tongue protrusion midline  MOTOR:  normal bulk and tone, full strength in the BUE, BLE  SENSORY:  normal and symmetric to light touch, pinprick, temperature, vibration  COORDINATION:  finger-nose-finger, fine finger movements normal  REFLEXES:  deep tendon reflexes present and symmetric  GAIT/STATION:  normal     DIAGNOSTIC DATA (LABS, IMAGING, TESTING) - I reviewed patient records, labs, notes, testing and imaging myself where available.  Lab Results  Component Value Date   WBC 7.3 01/29/2021   HGB 14.9 01/29/2021   HCT 44.8 01/29/2021   MCV 84.8 01/29/2021   PLT 306 01/29/2021      Component Value Date/Time   NA 137 01/29/2021 1339   K 4.0 01/29/2021 1339   CL 102 01/29/2021 1339  CO2 30 01/29/2021 1339   GLUCOSE 94 01/29/2021 1339   BUN 10 01/29/2021 1339   CREATININE 0.82 01/29/2021 1339   CALCIUM 9.1 01/29/2021 1339   GFRNONAA >60 01/29/2021 1339   No results found for: CHOL, HDL, LDLCALC, LDLDIRECT, TRIG, CHOLHDL No results found for: SWFU9N No results found for: VITAMINB12 No results found for: TSH  CT Head 8/18 No acute intracranial process   CTA neck 8/18 1. The common carotid, internal carotid and vertebral arteries are patent within the neck without stenosis, significant atherosclerotic disease or evidence of dissection. 2. Mildly enlarged right level 2 lymph node measuring 13 mm in short axis. Lymph nodes elsewhere within the neck are somewhat prominent, but measure subcentimeter in short axis. Clinical correlation and clinical follow-up recommended (with imaging follow-up as clinically warranted). 3. 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    CTA head 8/18: Unremarkable exam. No intracranial large vessel occlusion or  proximal high-grade arterial stenosis.    ASSESSMENT AND PLAN  30 y.o. year old male with no reported past medical history who is presenting after a total of 3 concussions.  Currently he reports symptom of headaches, numbness and tingling on the left side of his body associated with weakness.  This episodes are intermittent.  He also reported high stress due to the fact that he might be losing his job soon and he has a baby on the way.  His brain imaging are all within normal limits, no signs of stroke and no evidence of cervical dissection.  I explained to the patient that the headaches are likely from postconcussive syndrome.  For his uncontrolled shaking, numbness, tinglings these are less likely seizure and may be related to his increased stress level.  I will start the patient on amitriptyline 25 mg nightly to help with his headaches and also to help him with his anxiety.  Return as needed      1. Post concussive syndrome     PLAN: Start Amitriptyline 1/2 tablet for the first week then increase to full tablet nightly  Return as needed   No orders of the defined types were placed in this encounter.   Meds ordered this encounter  Medications   amitriptyline (ELAVIL) 25 MG tablet    Sig: Take 1 tablet (25 mg total) by mouth at bedtime.    Dispense:  30 tablet    Refill:  0    Return if symptoms worsen or fail to improve.    Windell Norfolk, MD 03/18/2021, 5:31 PM  [facility] [address], [address] [place], [place] [address] [phone]

## 2021-04-10 ENCOUNTER — Encounter: Payer: Self-pay | Admitting: Sports Medicine

## 2021-04-10 ENCOUNTER — Ambulatory Visit: Payer: Managed Care, Other (non HMO) | Admitting: Sports Medicine

## 2021-04-10 ENCOUNTER — Other Ambulatory Visit: Payer: Self-pay

## 2021-04-10 DIAGNOSIS — M79674 Pain in right toe(s): Secondary | ICD-10-CM | POA: Diagnosis not present

## 2021-04-10 DIAGNOSIS — L600 Ingrowing nail: Secondary | ICD-10-CM | POA: Diagnosis not present

## 2021-04-10 NOTE — Patient Instructions (Signed)

## 2021-04-10 NOTE — Progress Notes (Signed)
Subjective: Mike Nelson is a 30 y.o. male patient presents to office today complaining of a moderately painful incurvated, red, hot, swollen lateral nail border of the first toe on the right foot. This has been present for several weeks to months on his second round of antibiotics as prescribed by PCP. Patient has treated this by soaking.  Patient denies fever/chills/nausea/vomitting/any other related constitutional symptoms at this time.  Patient Active Problem List   Diagnosis Date Noted   Anxiety with depression    Class 3 severe obesity due to excess calories with body mass index (BMI) of 40.0 to 44.9 in adult [facility])    History of Wolff-Parkinson-White (WPW) syndrome    Postural dizziness with presyncope    WPW (Wolff-Parkinson-White syndrome) 12/22/2017   Vasovagal syncope 01/17/2017   Atypical chest pain 07/16/2015    Current Outpatient Medications on File Prior to Visit  Medication Sig Dispense Refill   amitriptyline (ELAVIL) 25 MG tablet Take 1 tablet (25 mg total) by mouth at bedtime. 30 tablet 0   Ascorbic Acid (VITAMIN C) 1000 MG tablet Take 1,000 mg by mouth daily.     cephALEXin (KEFLEX) 500 MG capsule Take 500 mg by mouth 3 (three) times daily.     Cholecalciferol (VITAMIN D) 50 MCG (2000 UT) CAPS Take 50 mcg by mouth daily.     clindamycin (CLEOCIN) 300 MG capsule Take 300 mg by mouth 3 (three) times daily.     fexofenadine (ALLEGRA) 60 MG tablet Take 60 mg by mouth daily.     meclizine (ANTIVERT) 25 MG tablet Take 25 mg by mouth 3 (three) times daily as needed.     No current facility-administered medications on file prior to visit.    Allergies  Allergen Reactions   Mushroom Extract Complex Swelling   Pineapple Hives    Objective:  There were no vitals filed for this visit.  General: Well developed, nourished, in no acute distress, alert and oriented x3   Dermatology: Skin is warm, dry and supple bilateral.  Right hallux nail appears to be severely incurvated  with hyperkeratosis formation at the distal aspects of the lateral nail border. (+) Erythema. (+) Edema. (-) serosanguous drainage present. The remaining nails appear unremarkable at this time. There are no open sores, lesions or other signs of infection  present.  Vascular: Dorsalis Pedis artery and Posterior Tibial artery pedal pulses are 2/4 bilateral with immedate capillary fill time. Pedal hair growth present. No lower extremity edema.   Neruologic: Grossly intact via light touch bilateral.  Musculoskeletal: Tenderness to palpation of the right hallux lateral nail fold(s). Muscular strength within normal limits in all groups bilateral.   Assesement and Plan: Problem List Items Addressed This Visit   None Visit Diagnoses     Ingrown nail    -  Primary   R hallux lateral margin   Toe pain, right           -Discussed treatment alternatives and plan of care; Explained permanent/temporary nail avulsion and post procedure course to patient. Patient elects for permanent nail removal right hallux lateral margin with phenol - After a verbal and written consent, injected 3 ml of a 50:50 mixture of 2% plain  lidocaine and 0.5% plain marcaine in a normal hallux block fashion. Next, a  betadine prep was performed. Anesthesia was tested and found to be appropriate.  The offending right hallux lateral nail border was then incised from the hyponychium to the epinychium. The offending nail border was removed and  cleared from the field. The area was curretted for any remaining nail or spicules. Phenol application performed and the area was then flushed with alcohol and dressed with antibiotic cream and a dry sterile dressing. -Patient was instructed to leave the dressing intact for today and begin soaking  in a weak solution of betadine or Epsom salt and water tomorrow. Patient was instructed to  soak for 15-20 minutes each day and apply Neosporin and a gauze or bandaid dressing each day. -Continue  oral antibiotics as given by PCP until completed -Patient was instructed to monitor the toe for signs of infection and return to office if toe becomes red, hot or swollen. -Advised ice, elevation, and tylenol or motrin if needed for pain.  -Patient is to return in 2-3 weeks for follow up care/nail check or sooner if problems arise.  Asencion Islam, DPM

## 2021-04-22 ENCOUNTER — Ambulatory Visit (INDEPENDENT_AMBULATORY_CARE_PROVIDER_SITE_OTHER): Payer: Managed Care, Other (non HMO) | Admitting: Sports Medicine

## 2021-04-22 DIAGNOSIS — M79674 Pain in right toe(s): Secondary | ICD-10-CM

## 2021-04-22 DIAGNOSIS — Z9889 Other specified postprocedural states: Secondary | ICD-10-CM

## 2021-04-22 NOTE — Progress Notes (Signed)
Virtual Visit via Telephone Note  I connected with Mike Nelson on 04/22/21 at  5:30 PM EST by telephone and verified that I am speaking with the correct person using two identifiers.  Location: Patient: Mike Nelson, home  Provider: Asencion Islam, DPM, TFAC    I discussed the limitations, risks, security and privacy concerns of performing an evaluation and management service by telephone and the availability of in person appointments. I also discussed with the patient that there may be a patient responsible charge related to this service. The patient expressed understanding and agreed to proceed.   History of Present Illness: 30 y/o male s/p R hallux lateral PNA. Reports that he has some drainage, redness and pain when trying to bend the toe. Denies any other symptoms.   Observations/Objective: Physical exam unable to be performed and patient did not have video capabilities on his phone.  Assessment and Plan: Problem List Items Addressed This Visit   None Visit Diagnoses     S/P nail surgery    -  Primary   Toe pain, right          Advised patient to change to betadine soaks and leave open to air at bedtime Advised patient that after nail surgery they may be stiffness of the toe that takes time to heal after the procedure and advised patient that sharp shooting pain can happen as well  Follow Up Instructions: Call office for follow up if fails to continue to improve after 1-2 more weeks   I discussed the assessment and treatment plan with the patient. The patient was provided an opportunity to ask questions and all were answered. The patient agreed with the plan and demonstrated an understanding of the instructions.   The patient was advised to call back or seek an in-person evaluation if the symptoms worsen or if the condition fails to improve as anticipated.  I provided 9 minutes of non-face-to-face time during this encounter.   Asencion Islam, DPM a

## 2021-04-24 ENCOUNTER — Ambulatory Visit: Payer: Managed Care, Other (non HMO) | Admitting: Sports Medicine

## 2021-05-15 ENCOUNTER — Other Ambulatory Visit: Payer: Self-pay | Admitting: Neurology

## 2021-07-04 ENCOUNTER — Emergency Department: Payer: Managed Care, Other (non HMO)

## 2021-07-04 ENCOUNTER — Other Ambulatory Visit: Payer: Self-pay

## 2021-07-04 DIAGNOSIS — G20 Parkinson's disease: Secondary | ICD-10-CM | POA: Diagnosis not present

## 2021-07-04 DIAGNOSIS — J45901 Unspecified asthma with (acute) exacerbation: Secondary | ICD-10-CM | POA: Insufficient documentation

## 2021-07-04 DIAGNOSIS — R0602 Shortness of breath: Secondary | ICD-10-CM | POA: Diagnosis present

## 2021-07-04 LAB — BASIC METABOLIC PANEL
Anion gap: 9 (ref 5–15)
BUN: 15 mg/dL (ref 6–20)
CO2: 26 mmol/L (ref 22–32)
Calcium: 8.8 mg/dL — ABNORMAL LOW (ref 8.9–10.3)
Chloride: 105 mmol/L (ref 98–111)
Creatinine, Ser: 1.02 mg/dL (ref 0.61–1.24)
GFR, Estimated: >60 mL/min (ref >60)
Glucose, Bld: 94 mg/dL (ref 70–99)
Potassium: 3.6 mmol/L (ref 3.5–5.1)
Sodium: 140 mmol/L (ref 135–145)

## 2021-07-04 LAB — CBC
HCT: 40.4 % (ref 39.0–52.0)
Hemoglobin: 13.6 g/dL (ref 13.0–17.0)
MCH: 28.2 pg (ref 26.0–34.0)
MCHC: 33.7 g/dL (ref 30.0–36.0)
MCV: 83.8 fL (ref 80.0–100.0)
Platelets: 259 10*3/uL (ref 150–400)
RBC: 4.82 MIL/uL (ref 4.22–5.81)
RDW: 12.6 % (ref 11.5–15.5)
WBC: 8.7 10*3/uL (ref 4.0–10.5)
nRBC: 0 % (ref 0.0–0.2)

## 2021-07-04 LAB — TROPONIN I (HIGH SENSITIVITY): Troponin I (High Sensitivity): <2 ng/L (ref <18)

## 2021-07-04 NOTE — ED Triage Notes (Signed)
Pt c/o episode of SOB that resolved with inhaler, pt c/o central nagging chest pain radiating into left arm and mild SOB at this time. Pt denies n/v/d. Pt AOX4, NAD noted. Pt has history of 3 MI's.

## 2021-07-05 ENCOUNTER — Emergency Department
Admission: EM | Admit: 2021-07-05 | Discharge: 2021-07-05 | Disposition: A | Discharge status: Home / Self Care | Payer: Managed Care, Other (non HMO) | Attending: Emergency Medicine | Admitting: Emergency Medicine

## 2021-07-05 DIAGNOSIS — J45901 Unspecified asthma with (acute) exacerbation: Secondary | ICD-10-CM

## 2021-07-05 LAB — D-DIMER, QUANTITATIVE: D-Dimer, Quant: <0.27 ug/mL-FEU (ref 0.00–0.50)

## 2021-07-05 LAB — TROPONIN I (HIGH SENSITIVITY): Troponin I (High Sensitivity): 2 ng/L (ref <18)

## 2021-07-05 MED ORDER — PREDNISONE 20 MG PO TABS
60.0000 mg | ORAL_TABLET | Freq: Once | ORAL | Status: AC
  Administered 2021-07-05: 60 mg via ORAL
  Filled 2021-07-05: qty 3

## 2021-07-05 MED ORDER — ALBUTEROL SULFATE HFA 108 (90 BASE) MCG/ACT IN AERS: 2.0000 | INHALATION_SPRAY | Freq: Four times a day (QID) | RESPIRATORY_TRACT | 2 refills | Status: AC | PRN

## 2021-07-05 MED ORDER — IPRATROPIUM-ALBUTEROL 0.5-2.5 (3) MG/3ML IN SOLN
3.0000 mL | Freq: Once | RESPIRATORY_TRACT | Status: AC
  Administered 2021-07-05: 3 mL via RESPIRATORY_TRACT
  Filled 2021-07-05: qty 3

## 2021-07-05 MED ORDER — PREDNISONE 20 MG PO TABS: 60.0000 mg | ORAL_TABLET | Freq: Every day | ORAL | 0 refills | Status: AC

## 2021-07-05 NOTE — ED Provider Notes (Signed)
[facility] Provider Note    Event Date/Time   First MD Initiated Contact with Patient 07/05/21 0011     (approximate)   History   Shortness of Breath and Chest Pain   HPI  Mike Nelson is a 31 y.o. male with a history of anxiety, obesity, WPW, asthma who presents for evaluation of shortness of breath and chest pain.  Patient reports that he was sitting on the couch in his brother's house when he started having difficulty breathing, chest tightness and wheezing.  He tried walking outside for the cooler weather and to go to the car to get his inhaler but his symptoms became severe.  His wife got him his inhaler, he used 2 puffs and felt improved.  However since then he has been having these intermittent episodes of squeezing chest pain that lasts a few seconds at a time.  Patient denies any allergen exposure leading to this.  Denies any prior history of PE or DVT, no recent travel immobilization, no leg pain or swelling, no hemoptysis or exogenous hormones.  Denies any recent illnesses, cough, congestion, sore throat, vomiting or diarrhea.  No fever or chills.     Past Medical History:  Diagnosis Date   Anxiety with depression    Atypical chest pain 07/16/2015   Class 3 severe obesity due to excess calories with body mass index (BMI) of 40.0 to 44.9 in adult [facility])    History of Wolff-Parkinson-White (WPW) syndrome    Postural dizziness with presyncope    Vasovagal syncope 01/17/2017   WPW (Wolff-Parkinson-White syndrome) 12/22/2017    Past Surgical History:  Procedure Laterality Date   ABLATION     cardiac     Physical Exam   Triage Vital Signs: ED Triage Vitals  Enc Vitals Group     BP 07/04/21 2131 122/87     Pulse Rate 07/04/21 2131 78     Resp 07/04/21 2131 18     Temp 07/04/21 2131 98 F (36.7 C)     Temp Source 07/04/21 2131 Oral     SpO2 07/04/21 2131 97 %     Weight --      Height --      Head Circumference --      Peak Flow --       Pain Score 07/04/21 2139 3     Pain Loc --      Pain Edu? --      Excl. in [place]? --     Most recent vital signs: Vitals:   07/04/21 2131  BP: 122/87  Pulse: 78  Resp: 18  Temp: 98 F (36.7 C)  SpO2: 97%     Constitutional: Alert and oriented. Well appearing and in no apparent distress. HEENT:      Head: Normocephalic and atraumatic.         Eyes: Conjunctivae are normal. Sclera is non-icteric.       Mouth/Throat: Mucous membranes are moist.       Neck: Supple with no signs of meningismus. Cardiovascular: Regular rate and rhythm. No murmurs, gallops, or rubs. 2+ symmetrical distal pulses are present in all extremities.  Respiratory: Normal respiratory effort.  Normal sats but slightly diminished air movement bilaterally with faint wheezing Gastrointestinal: Soft, non tender, and non distended with positive bowel sounds. No rebound or guarding. Genitourinary: No CVA tenderness. Musculoskeletal:  No edema, cyanosis, or erythema of extremities. Neurologic: Normal speech and language. Face is symmetric. Moving all extremities. No gross focal  neurologic deficits are appreciated. Skin: Skin is warm, dry and intact. No rash noted. Psychiatric: Mood and affect are normal. Speech and behavior are normal.  ED Results / Procedures / Treatments   Labs (all labs ordered are listed, but only abnormal results are displayed) Labs Reviewed  BASIC METABOLIC PANEL - Abnormal; Notable for the following components:      Result Value   Calcium 8.8 (*)    All other components within normal limits  CBC  D-DIMER, QUANTITATIVE  TROPONIN I (HIGH SENSITIVITY)  TROPONIN I (HIGH SENSITIVITY)     EKG  ED ECG REPORT I, Rudene Re, the attending physician, personally viewed and interpreted this ECG.  Normal sinus rhythm with a rate of 82, normal intervals, normal axis, no ST elevations or depressions, T wave inversions in inferior leads   RADIOLOGY I, Rudene Re, attending MD,  have personally viewed and interpreted the images obtained during this visit as below:  Chest x-ray with no evidence of pneumonia or pneumothorax   ___________________________________________________ Interpretation by Radiologist:  DG Chest 2 View  Result Date: 07/04/2021 CLINICAL DATA:  Chest pain EXAM: CHEST - 2 VIEW COMPARISON:  04/07/2019 FINDINGS: The heart size and mediastinal contours are within normal limits. Both lungs are clear. The visualized skeletal structures are unremarkable. IMPRESSION: No active cardiopulmonary disease. Electronically Signed   By: Fidela Salisbury M.D.   On: 07/04/2021 22:04      PROCEDURES:  Critical Care performed: No  Procedures    IMPRESSION / MDM / ASSESSMENT AND PLAN / ED COURSE  I reviewed the triage vital signs and the nursing notes.  31 y.o. male with a history of anxiety, obesity, WPW, asthma who presents for evaluation of shortness of breath, wheezing, and intermittent squeezing chest pain that started prior to arrival.  Patient is well-appearing in no distress with normal vital signs, normal work of breathing normal sats, has slightly decreased air movement bilaterally with faint wheezing.  Ddx: Asthma exacerbation versus pneumothorax versus pneumonia versus viral syndrome versus PE versus myocarditis versus pericarditis   Plan: EKG, chest x-ray, D-dimer, troponin x2, BMP and CBC.  Patient placed on telemetry for close monitoring of cardiorespiratory status.  We will start treatment with 3 DuoNeb's and steroids   MEDICATIONS GIVEN IN ED: Medications  ipratropium-albuterol (DUONEB) 0.5-2.5 (3) MG/3ML nebulizer solution 3 mL (3 mLs Nebulization Given 07/05/21 0128)  ipratropium-albuterol (DUONEB) 0.5-2.5 (3) MG/3ML nebulizer solution 3 mL (3 mLs Nebulization Given 07/05/21 0128)  predniSONE (DELTASONE) tablet 60 mg (60 mg Oral Given 07/05/21 0127)     ED COURSE: Chest x-ray with no signs of pneumothorax or pneumonia.  EKG and 2  high-sensitivity troponins with no signs of ischemia or myocarditis.  D-dimer is negative.  Metabolic panel with no acute abnormalities.  No leukocytosis.  After 3 DuoNeb's and steroids patient felt improved and did not have any further episodes of squeezing chest pain.  Admission was considered but felt unnecessary based on the normal work-up and resolution of symptoms.  Patient will be discharged on prednisone and albuterol with close follow-up with PCP.  Discussed my standard return precautions.   Consults: None   EMR reviewed including last visit with patient's cardiologist from 2019       FINAL CLINICAL IMPRESSION(S) / ED DIAGNOSES   Final diagnoses:  Mild asthma with exacerbation, unspecified whether persistent     Rx / DC Orders   ED Discharge Orders          Ordered  albuterol (VENTOLIN HFA) 108 (90 Base) MCG/ACT inhaler  Every 6 hours PRN        07/05/21 0327    predniSONE (DELTASONE) 20 MG tablet  Daily with breakfast        07/05/21 0327             Note:  This document was prepared using Dragon voice recognition software and may include unintentional dictation errors.   Alfred Levins, [place], MD 07/05/21 [phone]

## 2021-07-14 ENCOUNTER — Encounter: Payer: Self-pay | Admitting: *Deleted

## 2021-07-14 ENCOUNTER — Encounter: Payer: Self-pay | Admitting: Cardiology

## 2021-07-15 ENCOUNTER — Other Ambulatory Visit: Payer: Self-pay

## 2021-07-17 ENCOUNTER — Ambulatory Visit: Payer: Managed Care, Other (non HMO) | Admitting: Cardiology

## 2021-07-17 ENCOUNTER — Other Ambulatory Visit: Payer: Self-pay

## 2021-07-17 ENCOUNTER — Encounter: Payer: Self-pay | Admitting: Cardiology

## 2021-07-17 VITALS — BP 130/92 | HR 82 | Ht 71.0 in | Wt 294.0 lb

## 2021-07-17 DIAGNOSIS — R0789 Other chest pain: Secondary | ICD-10-CM

## 2021-07-17 DIAGNOSIS — Z8679 Personal history of other diseases of the circulatory system: Secondary | ICD-10-CM

## 2021-07-17 DIAGNOSIS — R030 Elevated blood-pressure reading, without diagnosis of hypertension: Secondary | ICD-10-CM | POA: Diagnosis not present

## 2021-07-17 NOTE — Patient Instructions (Addendum)
Medication Instructions:  Your physician recommends that you continue on your current medications as directed. Please refer to the Current Medication list given to you today.  *If you need a refill on your cardiac medications before your next appointment, please call your pharmacy*   Lab Work: None ordered If you have labs (blood work) drawn today and your tests are completely normal, you will receive your results only by: MyChart Message (if you have MyChart) OR A paper copy in the mail If you have any lab test that is abnormal or we need to change your treatment, we will call you to review the results.   Testing/Procedures:      Stress Echocardiogram Information Sheet                                                      Instructions:    1. You may take your morning medications the morning of the test  2. Light breakfast no caffeine  3. Dress prepared to exercise.  4. DO NOT use ANY caffeine or tobacco products 3 hours before appointment.  5. Please bring all current prescription medications.    Follow-Up: At [facility], you and your health needs are our priority.  As part of our continuing mission to provide you with exceptional heart care, we have created designated Provider Care Teams.  These Care Teams include your primary Cardiologist (physician) and Advanced Practice Providers (APPs -  Physician Assistants and Nurse Practitioners) who all work together to provide you with the care you need, when you need it.  We recommend signing up for the patient portal called "MyChart".  Sign up information is provided on this After Visit Summary.  MyChart is used to connect with patients for Virtual Visits (Telemedicine).  Patients are able to view lab/test results, encounter notes, upcoming appointments, etc.  Non-urgent messages can be sent to your provider as well.   To learn more about what you can do with MyChart, go to [email or web address].    Your next appointment:   3  month(s)  The format for your next appointment:   In Person  Provider:   Belva Cromeajan Revankar, MD   Other Instructions Exercise Stress Echocardiogram An exercise stress echocardiogram is a test to check how well your heart is working. This test uses sound waves and a computer to make pictures of your heart. These pictures will be taken before and after you exercise. For this test, you will walk on a treadmill or ride a bicycle to make your heart beat faster. While you exercise, your heart will be checked with an electrocardiogram (ECG). Your blood pressure will also be checked. You may have this test if: You have chest pain or a heart problem. You had a heart attack or heart surgery not long ago. You have heart valve problems. You have a condition that causes narrowing of the blood vessels that supply your heart. You have a high risk of heart disease and: You are starting a new exercise program. You need to have a big surgery. Tell a doctor about: Any allergies you have. All medicines you are taking. This includes vitamins, herbs, eye drops, creams, and over-the-counter medicines. Any problems you or family members have had with medicines that make you fall asleep (anesthetic medicines). Any surgeries you have had. Any blood disorders you have.  Any medical conditions you have. Whether you are pregnant or may be pregnant. What are the risks? Generally, this is a safe test. However, problems may occur, including: Chest pain. Feeling dizzy or light-headed. Shortness of breath. Increased or irregular heartbeat. Feeling like you may vomit (nausea) or vomiting. Heart attack. This is very rare. What happens before the test? Medicines Ask your doctor about changing or stopping your normal medicines. This is important if you take diabetes medicines or blood thinners. If you use an inhaler, bring it to the test. General instructions Wear comfortable clothes and walking shoes. Follow  instructions from your doctor about what you cannot eat or drink before the test. Do not drink or eat anything that has caffeine in it. Stop having caffeine 24 hours before the test. Do not smoke or use products that contain nicotine or tobacco for 4 hours before the test. If you need help quitting, ask your doctor. What happens during the test?  You will take off your clothes from the waist up and put on a hospital gown. Electrodes or patches will be put on your chest. A blood pressure cuff will be put on your arm. Before you exercise, a computer will make a picture of your heart. To do this: You will lie down and a gel will be put on your chest. A wand will be moved over the gel. Sound waves from the wand will go to the computer to make the picture. Then, you will start to exercise. You may walk on a treadmill or pedal a bicycle. Your blood pressure and heart rhythm will be checked while you exercise. The exercise will get harder or faster. You will exercise until: Your heart reaches a certain level. You are too tired to go on. You cannot go on because of chest pain, weakness, or dizziness. You will lie down right away so another picture of your heart can be taken. The procedure may vary among doctors and hospitals. What can I expect after the test? After your test, it is common to have: Mild soreness. Mild tiredness. Your heart rate and blood pressure will be checked until they return to your normal levels. You should not have any new symptoms after this test. Follow these instructions at home: If your doctor says that you can, you may: Eat what you normally eat. Do your normal activities. Take over-the-counter and prescription medicines only as told by your doctor. Keep all follow-up visits. It is up to you to get the results of your test. Ask how to get your results when they are ready. Contact a doctor if: You feel dizzy or light-headed. You have a fast or irregular  heartbeat. You feel like you may vomit or you vomit. You have a headache. You feel short of breath. Get help right away if: You develop pain or pressure: In your chest. In your jaw or neck. Between your shoulders. That goes down your left arm. You faint. You have trouble breathing. These symptoms may be an emergency. Get medical help right away. Call your local emergency services (911 in the [place] Do not wait to see if the symptoms will go away. Do not drive yourself to the hospital. Summary This is a test that checks how well your heart is working. Follow instructions about what you cannot eat or drink before the test. Ask your doctor if you should take your normal medicines before the test. Stop having caffeine 24 hours before the test. Do not smoke or use products  with nicotine or tobacco in them for 4 hours before the test. During the test, your blood pressure and heart rhythm will be checked while you exercise. This information is not intended to replace advice given to you by your health care provider. Make sure you discuss any questions you have with your health care provider. Document Revised: 02/11/2021 Document Reviewed: 01/22/2020 Elsevier Patient Education  2022 [employer].   FDA-cleared personal EKG: The worlds most clinically validated personal EKG, FDA-cleared to detect Atrial Fibrillation, Bradycardia, and Tachycardia. Mayford Knife is the most reliable way to check in on your heart from home. Take your EKG from anywhere: Capture a medical-grade EKG in 30 seconds and get an instant analysis right on your smartphone. Mayford Knife is small enough to fit in your pocket, so you can take it with you anywhere. Easy to use: Simply place your fingers on the sensors--no wires, patches, or gels. Recommended by doctors: A trusted resource, Mayford Knife is the #1 doctor-recommended personal EKG with more than 100 million EKGs recorded. Save or share your EKGs: With the press of  a button, email your EKGs to your doctor or save them on your phone. Works with smartphones: Compatible with Event organiser and tablets. Check our compatibility chart. FSA/HSA eligible: Purchase using an FSA or HSA account (please confirm coverage with your insurance provider). Phone clip included with purchase, a $15 value. Conveniently take your device with you wherever you go.  [email or web address]  Step One- Record your EKG strip on [facility] app.   Step two- On Kardia EKG click Download   Step three- It will prompt you to make a password for this EKG. Please make the password Dulce Sellar so that we can view it.   Step four- Click on the little upload button (small box with an arrow in the middle) in the bottom left-hand corner of the screen.   Step five- Click Save to Files  Step six- Click on On my iphone and then Pages then press save in the top right-hand corner.   NOW GO TO MYCHART   Once on MyChart click Messages  Step one- Click Send a message  Step two- Click Ask a medical question   Step three- Click Non urgent medical question   Step four- Click on [facility] name.  Step five- Click on the small paperclip at the bottom of the screen  Step six- Click Choose file  Step seven- Pick the most recent EKG strip listed.   Once uploaded send the message!

## 2021-07-17 NOTE — Progress Notes (Signed)
Cardiology Office Note:    Date:  07/17/2021   ID:  Mike Nelson, DOB [birth year], MRN [id number]  PCP:  Ninfa Meeker, FNP  Cardiologist:  Garwin Brothers, MD   Referring MD: Vertell Novak*    ASSESSMENT:    1. History of Wolff-Parkinson-White (WPW) syndrome   2. Atypical chest pain   3. Morbid obesity (HCC)   4. Elevated blood pressure reading in office without diagnosis of hypertension    PLAN:    In order of problems listed above:  Primary prevention stressed with the patient.  Importance of compliance with diet medication stressed and vocalized understanding.  He was advised to walk at least half an hour a day 5 days a week and he promises to do so. Elevated blood pressure: Patient has history of hypertension.  Diet and lifestyle modification was emphasized and he promises to do so. Morbid obesity: Risks of obesity explained weight reduction stressed and he promises to do better.  Lipids followed by primary care. Chest pain: Atypical in nature.  I reassured him about this.  We will do a stress echo for the reassurance. Patient will be seen in follow-up appointment in 6 months or earlier if the patient has any concerns.  I also told him to purchase a cardia app to assess his rhythm if he has any issues or symptoms and send to [place] as necessary.   Medication Adjustments/Labs and Tests Ordered: Current medicines are reviewed at length with the patient today.  Concerns regarding medicines are outlined above.  No orders of the defined types were placed in this encounter.  No orders of the defined types were placed in this encounter.    History of Present Illness:    Mike Nelson is a 31 y.o. male who is being seen today for the evaluation of chest pain at the request of Maurie Boettcher T, F*.  Patient is a pleasant 31 year old male.  He has past medical history that is not much significant.  He has history of Wolff-Parkinson-White syndrome post ablation in  the remote past.  He tells me that he occasionally has chest pain.  This is not exertional.  No radiation to the neck or to the arm.  It is squeezing in nature.  He does not exercise on a regular basis.  He leads a sedentary lifestyle.  At the time of my evaluation, the patient is alert awake oriented and in no distress.  Patient mentions to me that with sexual activity.  He does not have any chest pain or any symptom symptoms.  Past Medical History:  Diagnosis Date   Anxiety with depression    Atypical chest pain 07/16/2015   Class 3 severe obesity due to excess calories with body mass index (BMI) of 40.0 to 44.9 in adult [facility])    History of Wolff-Parkinson-White (WPW) syndrome    Postural dizziness with presyncope    Vasovagal syncope 01/17/2017   WPW (Wolff-Parkinson-White syndrome) 12/22/2017    Past Surgical History:  Procedure Laterality Date   ABLATION  2009   cardiac    Current Medications: Current Meds  Medication Sig   albuterol (VENTOLIN HFA) 108 (90 Base) MCG/ACT inhaler Inhale 2 puffs into the lungs every 6 (six) hours as needed for wheezing or shortness of breath.   amitriptyline (ELAVIL) 25 MG tablet TAKE 1 TABLET BY MOUTH AT BEDTIME   Ascorbic Acid (VITAMIN C) 1000 MG tablet Take 1,000 mg by mouth daily.   Cholecalciferol (VITAMIN D) 50  MCG (2000 UT) CAPS Take 50 mcg by mouth daily.   fexofenadine (ALLEGRA) 60 MG tablet Take 60 mg by mouth daily.     Allergies:   Meclizine, Mushroom extract complex, and Pineapple   Social History   Socioeconomic History   Marital status: Married    Spouse name: Not on file   Number of children: Not on file   Years of education: Not on file   Highest education level: Not on file  Occupational History   Not on file  Tobacco Use   Smoking status: Never   Smokeless tobacco: Never  Substance and Sexual Activity   Alcohol use: Not on file   Drug use: Not on file   Sexual activity: Not on file  Other Topics Concern   Not on  file  Social History Narrative   Not on file   Social Determinants of Health   Financial Resource Strain: Not on file  Food Insecurity: Not on file  Transportation Needs: Not on file  Physical Activity: Not on file  Stress: Not on file  Social Connections: Not on file     Family History: The patient's family history includes Diabetes in his mother; Hypertension in his father.  ROS:   Please see the history of present illness.    All other systems reviewed and are negative.  EKGs/Labs/Other Studies Reviewed:    The following studies were reviewed today: EKG reveals sinus rhythm and nonspecific ST-T changes   Recent Labs: 07/04/2021: BUN 15; Creatinine, Ser 1.02; Hemoglobin 13.6; Platelets 259; Potassium 3.6; Sodium 140  Recent Lipid Panel No results found for: CHOL, TRIG, HDL, CHOLHDL, VLDL, LDLCALC, LDLDIRECT  Physical Exam:    VS:  BP (!) 130/92    Pulse 82    Ht 5\' 11"  (1.803 m)    Wt 294 lb (133.4 kg)    SpO2 97%    BMI 41.00 kg/m     Wt Readings from Last 3 Encounters:  07/17/21 294 lb (133.4 kg)  03/18/21 290 lb (131.5 kg)  01/29/21 287 lb (130.2 kg)     GEN: Patient is in no acute distress HEENT: Normal NECK: No JVD; No carotid bruits LYMPHATICS: No lymphadenopathy CARDIAC: S1 S2 regular, 2/6 systolic murmur at the apex. RESPIRATORY:  Clear to auscultation without rales, wheezing or rhonchi  ABDOMEN: Soft, non-tender, non-distended MUSCULOSKELETAL:  No edema; No deformity  SKIN: Warm and dry NEUROLOGIC:  Alert and oriented x 3 PSYCHIATRIC:  Normal affect    Signed, 01/31/21, MD  07/17/2021 2:05 PM    [facility]

## 2021-07-23 ENCOUNTER — Telehealth (HOSPITAL_COMMUNITY): Payer: Self-pay | Admitting: Cardiology

## 2021-07-23 NOTE — Telephone Encounter (Signed)
I called to schedule Stress echocardiogram that was ordered by MD and patient does not wish to have. He states he has a lot going on " on his [place] the [place]".  Order will be removed from the ECHO WQ. If patient calls back to schedule at a later date we will reinstate the order.

## 2021-08-24 DIAGNOSIS — M79641 Pain in right hand: Secondary | ICD-10-CM | POA: Insufficient documentation

## 2021-08-24 DIAGNOSIS — M25512 Pain in left shoulder: Secondary | ICD-10-CM | POA: Insufficient documentation

## 2021-10-07 DIAGNOSIS — G5601 Carpal tunnel syndrome, right upper limb: Secondary | ICD-10-CM | POA: Insufficient documentation

## 2021-10-07 DIAGNOSIS — M7520 Bicipital tendinitis, unspecified shoulder: Secondary | ICD-10-CM | POA: Insufficient documentation

## 2023-09-13 IMAGING — CR DG CHEST 2V
2 series · 2 of 2 positions shown · non-contrast
Comparison: 04/07/2019

CLINICAL DATA: Chest pain

EXAM:
CHEST - 2 VIEW

[chest pa]
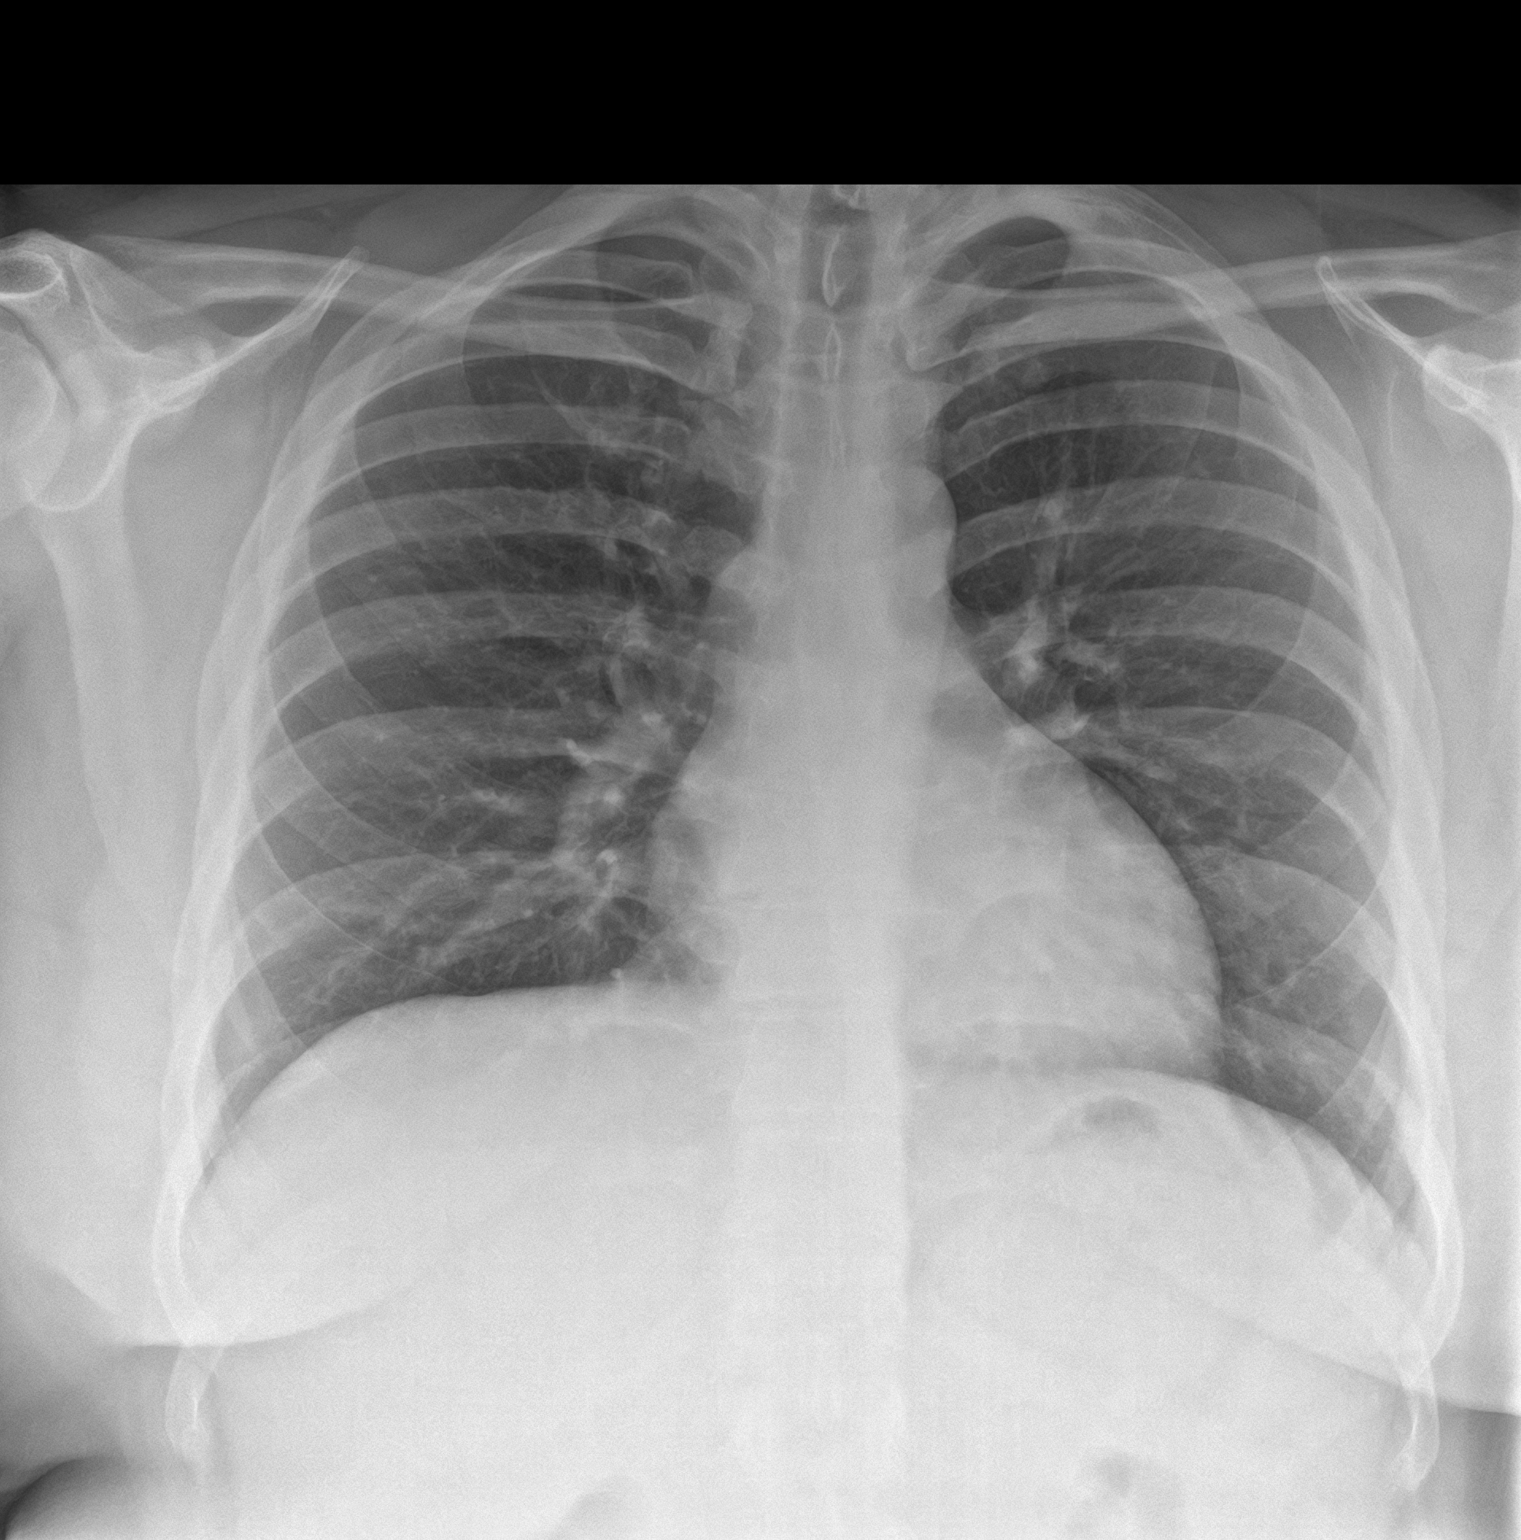

[chest lat]
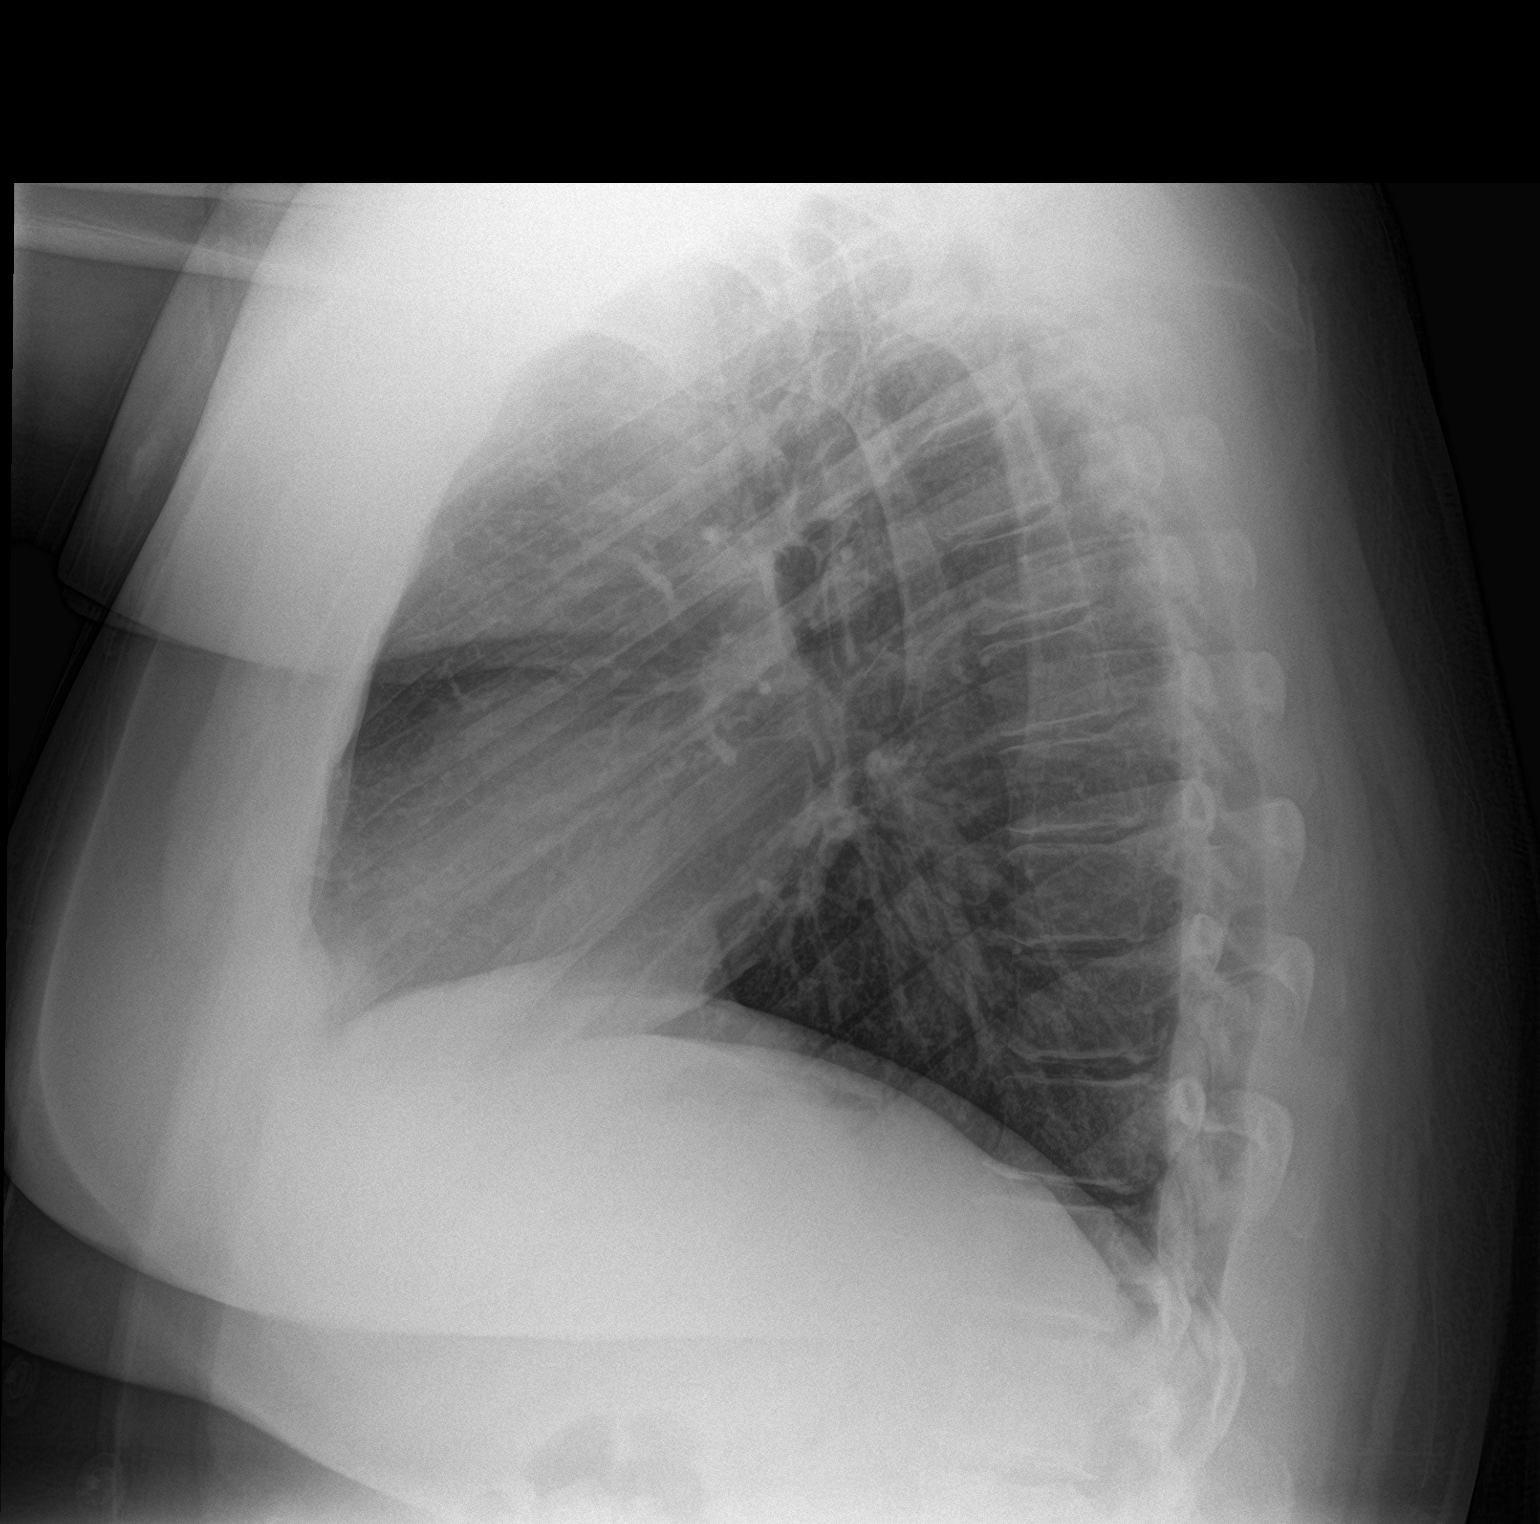

[2 of 2 positions shown; findings below may reference images not displayed]

FINDINGS: The heart size and mediastinal contours are within normal limits.
Both lungs are clear. The visualized skeletal structures are
unremarkable.
IMPRESSION: No active cardiopulmonary disease.

## 2023-12-22 ENCOUNTER — Encounter: Payer: Self-pay | Admitting: Cardiology

## 2023-12-22 ENCOUNTER — Ambulatory Visit: Attending: Cardiology | Admitting: Cardiology

## 2023-12-22 ENCOUNTER — Ambulatory Visit: Attending: Cardiology

## 2023-12-22 VITALS — BP 120/100 | HR 70 | Ht 71.0 in | Wt 292.0 lb

## 2023-12-22 DIAGNOSIS — R011 Cardiac murmur, unspecified: Secondary | ICD-10-CM

## 2023-12-22 DIAGNOSIS — R030 Elevated blood-pressure reading, without diagnosis of hypertension: Secondary | ICD-10-CM

## 2023-12-22 DIAGNOSIS — Z8679 Personal history of other diseases of the circulatory system: Secondary | ICD-10-CM

## 2023-12-22 LAB — ECHOCARDIOGRAM COMPLETE
AR max vel: 3.2 cm2
AV Area VTI: 3.45 cm2
AV Area mean vel: 3.08 cm2
AV Mean grad: 3 mmHg
AV Peak grad: 6.2 mmHg
Ao pk vel: 1.24 m/s
Area-P 1/2: 4.6 cm2
Height: 71 in
S' Lateral: 3.21 cm
Weight: 4672 [oz_av]

## 2023-12-22 NOTE — Patient Instructions (Signed)
 Medication Instructions:  Your physician recommends that you continue on your current medications as directed. Please refer to the Current Medication list given to you today.  *If you need a refill on your cardiac medications before your next appointment, please call your pharmacy*   Lab Work: None ordered If you have labs (blood work) drawn today and your tests are completely normal, you will receive your results only by: MyChart Message (if you have MyChart) OR A paper copy in the mail If you have any lab test that is abnormal or we need to change your treatment, we will call you to review the results.   Testing/Procedures: Your physician has requested that you have an echocardiogram. Echocardiography is a painless test that uses sound waves to create images of your heart. It provides your doctor with information about the size and shape of your heart and how well your heart's chambers and valves are working. This procedure takes approximately one hour. There are no restrictions for this procedure. Please do NOT wear cologne, perfume, aftershave, or lotions (deodorant is allowed). Please arrive 15 minutes prior to your appointment time.  Please note: We ask at that you not bring children with you during ultrasound (echo/ vascular) testing. Due to room size and safety concerns, children are not allowed in the ultrasound rooms during exams. Our front office staff cannot provide observation of children in our lobby area while testing is being conducted. An adult accompanying a patient to their appointment will only be allowed in the ultrasound room at the discretion of the ultrasound technician under special circumstances. We apologize for any inconvenience.    Follow-Up: At [facility], you and your health needs are our priority.  As part of our continuing mission to provide you with exceptional heart care, we have created designated Provider Care Teams.  These Care Teams include your  primary Cardiologist (physician) and Advanced Practice Providers (APPs -  Physician Assistants and Nurse Practitioners) who all work together to provide you with the care you need, when you need it.  We recommend signing up for the patient portal called "MyChart".  Sign up information is provided on this After Visit Summary.  MyChart is used to connect with patients for Virtual Visits (Telemedicine).  Patients are able to view lab/test results, encounter notes, upcoming appointments, etc.  Non-urgent messages can be sent to your provider as well.   To learn more about what you can do with MyChart, go to [email or web address].    Your next appointment:   12 month(s)  The format for your next appointment:   In Person  Provider:   Hillis Lu, MD   Other Instructions Echocardiogram An echocardiogram is a test that uses sound waves (ultrasound) to produce images of the heart. Images from an echocardiogram can provide important information about: Heart size and shape. The size and thickness and movement of your heart's walls. Heart muscle function and strength. Heart valve function or if you have stenosis. Stenosis is when the heart valves are too narrow. If blood is flowing backward through the heart valves (regurgitation). A tumor or infectious growth around the heart valves. Areas of heart muscle that are not working well because of poor blood flow or injury from a heart attack. Aneurysm detection. An aneurysm is a weak or damaged part of an artery wall. The wall bulges out from the normal force of blood pumping through the body. Tell a health care provider about: Any allergies you have. All medicines you are taking,  including vitamins, herbs, eye drops, creams, and over-the-counter medicines. Any blood disorders you have. Any surgeries you have had. Any medical conditions you have. Whether you are pregnant or may be pregnant. What are the risks? Generally, this is a safe test.  However, problems may occur, including an allergic reaction to dye (contrast) that may be used during the test. What happens before the test? No specific preparation is needed. You may eat and drink normally. What happens during the test? You will take off your clothes from the waist up and put on a hospital gown. Electrodes or electrocardiogram (ECG)patches may be placed on your chest. The electrodes or patches are then connected to a device that monitors your heart rate and rhythm. You will lie down on a table for an ultrasound exam. A gel will be applied to your chest to help sound waves pass through your skin. A handheld device, called a transducer, will be pressed against your chest and moved over your heart. The transducer produces sound waves that travel to your heart and bounce back (or "echo" back) to the transducer. These sound waves will be captured in real-time and changed into images of your heart that can be viewed on a video monitor. The images will be recorded on a computer and reviewed by your health care provider. You may be asked to change positions or hold your breath for a short time. This makes it easier to get different views or better views of your heart. In some cases, you may receive contrast through an IV in one of your veins. This can improve the quality of the pictures from your heart. The procedure may vary among health care providers and hospitals.   What can I expect after the test? You may return to your normal, everyday life, including diet, activities, and medicines, unless your health care provider tells you not to do that. Follow these instructions at home: It is up to you to get the results of your test. Ask your health care provider, or the department that is doing the test, when your results will be ready. Keep all follow-up visits. This is important. Summary An echocardiogram is a test that uses sound waves (ultrasound) to produce images of the heart. Images  from an echocardiogram can provide important information about the size and shape of your heart, heart muscle function, heart valve function, and other possible heart problems. You do not need to do anything to prepare before this test. You may eat and drink normally. After the echocardiogram is completed, you may return to your normal, everyday life, unless your health care provider tells you not to do that. This information is not intended to replace advice given to you by your health care provider. Make sure you discuss any questions you have with your health care provider. Document Revised: 01/22/2020 Document Reviewed: 01/22/2020 Elsevier Patient Education  2021 [employer]   Important Information About Sugar

## 2023-12-22 NOTE — Progress Notes (Signed)
 Cardiology Office Note:    Date:  12/22/2023   ID:  Mike Nelson, DOB 08-06-1990, MRN [id number]  PCP:  Fritz Greig DEL, PA-C  Cardiologist:  Jennifer JONELLE Crape, MD   Referring MD: No ref. provider found    ASSESSMENT:    1. History of Wolff-Parkinson-White (WPW) syndrome   2. Morbid obesity (HCC)    PLAN:    In order of problems listed above:  Primary prevention stressed with the patient.  Importance of compliance with diet medication stressed and patient verbalized standing. Cardiac murmur: Echocardiogram will be done to assess murmur heard on auscultation. Patient has good effort tolerance he is completely asymptomatic especially from the WPW standpoint.  His EKG looks unremarkable today.  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  I discussed this with him at length and questions were answered to his satisfaction. Morbid obesity: Weight reduction stressed and diet emphasized and he promises to do better.  Risks of obesity explained. Patient will be seen in follow-up appointment in 12 months or earlier if the patient has any concerns.    Medication Adjustments/Labs and Tests Ordered: Current medicines are reviewed at length with the patient today.  Concerns regarding medicines are outlined above.  Orders Placed This Encounter  Procedures   EKG 12-Lead   No orders of the defined types were placed in this encounter.    Chief Complaint  Patient presents with   Pre-op Exam     History of Present Illness:    Mike Nelson is a 33 y.o. male.  Patient has past medical history of WPW syndrome post ablation.  Subsequently is never had any issues with palpitations dizziness or any such problems.  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  He tells me that he walks about a mile a mile and a half on a daily basis without any symptoms.  No chest pain orthopnea or PND.  No palpitations.  At the time of my evaluation, the  patient is alert awake oriented and in no distress.  He has never had a syncopal event in the past several years or any dizziness.  Past Medical History:  Diagnosis Date   Anxiety with depression    Atypical chest pain 07/16/2015   Class 3 severe obesity due to excess calories with body mass index (BMI) of 40.0 to 44.9 in adult    History of Wolff-Parkinson-White (WPW) syndrome    Postural dizziness with presyncope    Vasovagal syncope 01/17/2017   WPW (Wolff-Parkinson-White syndrome) 12/22/2017    Past Surgical History:  Procedure Laterality Date   ABLATION  2009   cardiac    Current Medications: Current Meds  Medication Sig   albuterol  (VENTOLIN  HFA) 108 (90 Base) MCG/ACT inhaler Inhale 2 puffs into the lungs every 6 (six) hours as needed for wheezing or shortness of breath.   fexofenadine (ALLEGRA) 60 MG tablet Take 60 mg by mouth daily.   Multiple Vitamin (MULTIVITAMIN) tablet Take 1 tablet by mouth daily.     Allergies:   Meclizine, Mushroom extract complex (obsolete), and Pineapple   Social History   Socioeconomic History   Marital status: Married    Spouse name: Not on file   Number of children: Not on file   Years of education: Not on file   Highest education level: Not on file  Occupational History   Not on file  Tobacco Use   Smoking status:  Never   Smokeless tobacco: Never  Substance and Sexual Activity   Alcohol use: Not on file   Drug use: Not on file   Sexual activity: Not on file  Other Topics Concern   Not on file  Social History Narrative   Not on file   Social Drivers of Health   Financial Resource Strain: Not on file  Food Insecurity: Not on file  Transportation Needs: Not on file  Physical Activity: Not on file  Stress: Not on file  Social Connections: Not on file     Family History: The patient's family history includes Diabetes in his mother; Hypertension in his father.  ROS:   Please see the history of present illness.    All  other systems reviewed and are negative.  EKGs/Labs/Other Studies Reviewed:    The following studies were reviewed today: .SABRAEKG Interpretation Date/Time:  Thursday December 22 2023 12:52:02 EDT Ventricular Rate:  70 PR Interval:  148 QRS Duration:  82 QT Interval:  360 QTC Calculation: 388 R Axis:   29  Text Interpretation: Normal sinus rhythm Normal ECG When compared with ECG of 04-Jul-2021 21:34, Minimal criteria for Inferior infarct are no longer Present Nonspecific T wave abnormality no longer evident in Lateral leads Confirmed by Edwyna Backers [phone]) on 12/22/2023 1:28:08 PM     Recent Labs: No results found for requested labs within last 365 days.  Recent Lipid Panel No results found for: CHOL, TRIG, HDL, CHOLHDL, VLDL, LDLCALC, LDLDIRECT  Physical Exam:    VS:  BP (!) 120/100   Pulse 70   Ht 5' 11 (1.803 m)   Wt 292 lb (132.5 kg)   SpO2 98%   BMI 40.73 kg/m     Wt Readings from Last 3 Encounters:  12/22/23 292 lb (132.5 kg)  07/17/21 294 lb (133.4 kg)  03/18/21 290 lb (131.5 kg)     GEN: Patient is in no acute distress HEENT: Normal NECK: No JVD; No carotid bruits LYMPHATICS: No lymphadenopathy CARDIAC: Hear sounds regular, 2/6 systolic murmur at the apex. RESPIRATORY:  Clear to auscultation without rales, wheezing or rhonchi  ABDOMEN: Soft, non-tender, non-distended MUSCULOSKELETAL:  No edema; No deformity  SKIN: Warm and dry NEUROLOGIC:  Alert and oriented x 3 PSYCHIATRIC:  Normal affect   Signed, Backers JONELLE Edwyna, MD  12/22/2023 1:29 PM    [facility]

## 2023-12-23 ENCOUNTER — Telehealth: Payer: Self-pay | Admitting: Cardiology

## 2023-12-23 ENCOUNTER — Encounter: Payer: Self-pay | Admitting: Cardiology

## 2023-12-23 NOTE — Telephone Encounter (Signed)
 Pt called in about having DOT clearance letter written. Please advise. He states he needs by next week or he will not have job.   He stated it needs to be sent to [facility] - fax: [phone]

## 2023-12-23 NOTE — Telephone Encounter (Signed)
 The patient was seen by Dr. Edwyna yesterday and also had echo completed.  Per yesterday's notes:     PLAN: 3. Patient has good effort tolerance he is completely asymptomatic especially from the WPW standpoint. His EKG looks unremarkable today. 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 I discussed this with him at length and questions were answered to his satisfaction.  __________________________________________________________  Will route to Dr. Edwyna and his nurse for review of echo and follow up of DOT clearance letter if indicated.

## 2023-12-28 ENCOUNTER — Ambulatory Visit: Payer: Self-pay | Admitting: Cardiology
